# Patient Record
Sex: Female | Born: 1955 | Marital: Married | Smoking: Former smoker
Health system: Southern US, Community
[De-identification: ages and names within clinical notes are randomized; demographics above are authoritative.]

## PROBLEM LIST (undated history)

## (undated) DIAGNOSIS — R7301 Impaired fasting glucose: Secondary | ICD-10-CM

## (undated) DIAGNOSIS — E079 Disorder of thyroid, unspecified: Secondary | ICD-10-CM

## (undated) DIAGNOSIS — B070 Plantar wart: Secondary | ICD-10-CM

## (undated) DIAGNOSIS — F17200 Nicotine dependence, unspecified, uncomplicated: Secondary | ICD-10-CM

## (undated) DIAGNOSIS — B349 Viral infection, unspecified: Secondary | ICD-10-CM

## (undated) DIAGNOSIS — E785 Hyperlipidemia, unspecified: Secondary | ICD-10-CM

## (undated) DIAGNOSIS — U071 COVID-19: Secondary | ICD-10-CM

## (undated) DIAGNOSIS — E162 Hypoglycemia, unspecified: Secondary | ICD-10-CM

## (undated) DIAGNOSIS — E871 Hypo-osmolality and hyponatremia: Secondary | ICD-10-CM

## (undated) DIAGNOSIS — J309 Allergic rhinitis, unspecified: Secondary | ICD-10-CM

## (undated) DIAGNOSIS — F1020 Alcohol dependence, uncomplicated: Secondary | ICD-10-CM

## (undated) DIAGNOSIS — F39 Unspecified mood [affective] disorder: Secondary | ICD-10-CM

## (undated) DIAGNOSIS — D071 Carcinoma in situ of vulva: Secondary | ICD-10-CM

## (undated) DIAGNOSIS — Z720 Tobacco use: Secondary | ICD-10-CM

## (undated) DIAGNOSIS — I10 Essential (primary) hypertension: Secondary | ICD-10-CM

## (undated) DIAGNOSIS — T791XXA Fat embolism (traumatic), initial encounter: Secondary | ICD-10-CM

## (undated) DIAGNOSIS — E039 Hypothyroidism, unspecified: Secondary | ICD-10-CM

## (undated) DIAGNOSIS — T7411XA Adult physical abuse, confirmed, initial encounter: Secondary | ICD-10-CM

## (undated) DIAGNOSIS — I639 Cerebral infarction, unspecified: Secondary | ICD-10-CM

## (undated) DIAGNOSIS — F4310 Post-traumatic stress disorder, unspecified: Secondary | ICD-10-CM

## (undated) DIAGNOSIS — G459 Transient cerebral ischemic attack, unspecified: Secondary | ICD-10-CM

## (undated) DIAGNOSIS — J302 Other seasonal allergic rhinitis: Secondary | ICD-10-CM

## (undated) DIAGNOSIS — I739 Peripheral vascular disease, unspecified: Secondary | ICD-10-CM

## (undated) DIAGNOSIS — H9209 Otalgia, unspecified ear: Secondary | ICD-10-CM

## (undated) DIAGNOSIS — R29810 Facial weakness: Secondary | ICD-10-CM

## (undated) DIAGNOSIS — M5410 Radiculopathy, site unspecified: Secondary | ICD-10-CM

## (undated) DIAGNOSIS — J019 Acute sinusitis, unspecified: Secondary | ICD-10-CM

## (undated) DIAGNOSIS — C519 Malignant neoplasm of vulva, unspecified: Secondary | ICD-10-CM

## (undated) HISTORY — DX: Essential (primary) hypertension: I10

## (undated) HISTORY — DX: Cerebral infarction, unspecified: I63.9

## (undated) HISTORY — DX: Unspecified mood (affective) disorder: F39

## (undated) HISTORY — DX: Acute sinusitis, unspecified: J01.90

## (undated) HISTORY — DX: Hyperlipidemia, unspecified: E78.5

## (undated) HISTORY — DX: Fat embolism (traumatic), initial encounter: T79.1XXA

## (undated) HISTORY — DX: Radiculopathy, site unspecified: M54.10

## (undated) HISTORY — DX: Tobacco use: Z72.0

## (undated) HISTORY — DX: Nicotine dependence, unspecified, uncomplicated: F17.200

## (undated) HISTORY — DX: Carcinoma in situ of vulva: D07.1

## (undated) HISTORY — DX: Post-traumatic stress disorder, unspecified: F43.10

## (undated) HISTORY — DX: Hypoglycemia, unspecified: E16.2

## (undated) HISTORY — DX: Alcohol dependence, uncomplicated: F10.20

## (undated) HISTORY — DX: Facial weakness: R29.810

## (undated) HISTORY — DX: Allergic rhinitis, unspecified: J30.9

## (undated) HISTORY — DX: Hypothyroidism, unspecified: E03.9

## (undated) HISTORY — DX: Other seasonal allergic rhinitis: J30.2

## (undated) HISTORY — DX: Otalgia, unspecified ear: H92.09

## (undated) HISTORY — DX: Peripheral vascular disease, unspecified: I73.9

## (undated) HISTORY — DX: Malignant neoplasm of vulva, unspecified: C51.9

## (undated) HISTORY — DX: Impaired fasting glucose: R73.01

## (undated) HISTORY — DX: Hypo-osmolality and hyponatremia: E87.1

## (undated) HISTORY — DX: Adult physical abuse, confirmed, initial encounter: T74.11XA

## (undated) HISTORY — DX: Disorder of thyroid, unspecified: E07.9

## (undated) HISTORY — DX: Viral infection, unspecified: B34.9

## (undated) HISTORY — DX: Transient cerebral ischemic attack, unspecified: G45.9

## (undated) HISTORY — DX: COVID-19: U07.1

## (undated) HISTORY — DX: Plantar wart: B07.0

---

## 2000-08-30 ENCOUNTER — Other Ambulatory Visit: Admission: RE | Admit: 2000-08-30 | Discharge: 2000-08-30 | Payer: Self-pay | Admitting: Obstetrics & Gynecology

## 2001-10-31 ENCOUNTER — Other Ambulatory Visit: Admission: RE | Admit: 2001-10-31 | Discharge: 2001-10-31 | Payer: Self-pay | Admitting: Obstetrics & Gynecology

## 2002-11-06 ENCOUNTER — Other Ambulatory Visit: Admission: RE | Admit: 2002-11-06 | Discharge: 2002-11-06 | Payer: Self-pay | Admitting: Obstetrics & Gynecology

## 2002-11-13 ENCOUNTER — Encounter: Payer: Self-pay | Admitting: Obstetrics & Gynecology

## 2002-11-13 ENCOUNTER — Encounter: Admission: RE | Admit: 2002-11-13 | Discharge: 2002-11-13 | Payer: Self-pay | Admitting: Obstetrics & Gynecology

## 2003-11-19 ENCOUNTER — Other Ambulatory Visit: Admission: RE | Admit: 2003-11-19 | Discharge: 2003-11-19 | Payer: Self-pay | Admitting: Obstetrics & Gynecology

## 2004-12-01 ENCOUNTER — Other Ambulatory Visit: Admission: RE | Admit: 2004-12-01 | Discharge: 2004-12-01 | Payer: Self-pay | Admitting: Obstetrics & Gynecology

## 2009-12-09 ENCOUNTER — Encounter: Admission: RE | Admit: 2009-12-09 | Discharge: 2009-12-09 | Payer: Self-pay | Admitting: Obstetrics & Gynecology

## 2010-08-10 ENCOUNTER — Encounter: Payer: Self-pay | Admitting: Obstetrics & Gynecology

## 2012-08-04 HISTORY — PX: VULVAR LESION REMOVAL: SHX5391

## 2012-09-07 ENCOUNTER — Ambulatory Visit: Payer: BC Managed Care – PPO | Attending: Gynecologic Oncology | Admitting: Gynecologic Oncology

## 2012-09-07 ENCOUNTER — Encounter: Payer: Self-pay | Admitting: Gynecologic Oncology

## 2012-09-07 VITALS — BP 182/90 | HR 88 | Temp 98.6°F | Resp 18 | Ht 68.0 in | Wt 181.9 lb

## 2012-09-07 DIAGNOSIS — C519 Malignant neoplasm of vulva, unspecified: Secondary | ICD-10-CM | POA: Insufficient documentation

## 2012-09-07 NOTE — Patient Instructions (Signed)
Return to clinic in 3 months

## 2012-09-07 NOTE — Progress Notes (Signed)
Consult Note: Gyn-Onc  Meagan Riddle 57 y.o. female  CC:  Chief Complaint  Patient presents with  . Vulvar Cancer    New patient    HPI: Patient is seen today in consultation at the request of Dr. Konrad Dolores.  Patient is a 57 year old gravida 1 para 1 (she has a 55 year old son) who in about mid-Fall noticed a lesion on her vulva. It did not become painful until early part of January which time she went to see Dr. Jennette Kettle. 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 She then underwent a wide local excision of the vulva on January 16. Operative findings included a 1 cm lesion near the introitus but towards the left side. 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 There was high-grade dysplasia. There was a positive excisional margins. The deep margins were negative for dysplasia or malignancy. On pathology the tumor maximal size was 1.4 cm. It was a well-differentiated grade 1 squamous cell carcinoma. On the initial pathology report they stated that was difficult to determine the depth of invasion however the deep subcutaneous extension nor deep cervical margin involvement was identified. There was no perineural or lymphovascular space involvement. She comes in today for evaluation of the above.  Review of Systems: She has a change about bladder habits. She is sexually active. She did have some pain with intercourse prior to the surgical procedure has had pain since then as well. She believes the sutures are aggravating her and she went to have those removed. She has been under a lot of stress as she is one of the primary caretaker is for her mother who is 65 years old. Her review of systems is otherwise negative for 10 systems.  Her last mammogram was in 1 year ago. Her Pap smear was negative recently. She's never had a colonoscopy but she does the stool guaiac cards.  Current Meds:  Outpatient Encounter Prescriptions as of 09/07/2012  Medication Sig Dispense  Refill  . Ascorbic Acid (VITAMIN C PO) Take by mouth.      . estrogen, conjugated,-medroxyprogesterone (PREMPRO) 0.625-2.5 MG per tablet Take 1 tablet by mouth daily.      . fexofenadine-pseudoephedrine (ALLEGRA-D) 60-120 MG per tablet Take 1 tablet by mouth daily.      Marland Kitchen levothyroxine (SYNTHROID, LEVOTHROID) 137 MCG tablet Take 137 mcg by mouth daily.      Marland Kitchen liothyronine (CYTOMEL) 5 MCG tablet Take 5 mcg by mouth daily.      Marland Kitchen VITAMIN D, ERGOCALCIFEROL, PO Take by mouth daily.       No facility-administered encounter medications on file as of 09/07/2012.    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 She has cut back on her working schedule as she is a caretaker for her mom. History   Social History  . Marital Status: Married    Spouse Name: N/A    Number of Children: N/A  . Years of Education: N/A   Occupational History  . Not on file.   Social History Main Topics  . Smoking status: Former Smoker    Start date: 07/20/1977  . Smokeless tobacco: Not on file  . Alcohol Use: Yes     Comment: occas  . Drug Use: No  . Sexually Active: Yes   Other Topics Concern  . Not on file   Social History Narrative  . No narrative on file  Past Surgical Hx: Was into the extraction, and NSVD  Past Surgical History  Procedure Laterality Date  . Vulvar lesion removal  08/04/12    WLE of the vulva    Past Medical Hx:  Past Medical History  Diagnosis Date  . Vaginal delivery   . Hypothyroidism   . Vulvar cancer   . VIN III (vulvar intraepithelial neoplasia III)     Family Hx: Family believes that the breast cancers and her 3 paternal aunts as well as a prostate cancer father were due to exposures to pesticides as children [place]. There have not been any cancers in the subsequent generation. Family History  Problem Relation Age of Onset  . Hypothyroidism Mother   . Diabetes Mother   . Stroke  Mother   . Hypertension Mother   . Uterine cancer Mother   . Prostate cancer Father   . Hypothyroidism Sister   . Breast cancer Paternal Aunt   . Breast cancer Paternal Aunt   . Breast cancer Paternal Aunt     Vitals:  Blood pressure 182/90, pulse 88, temperature 98.6 F (37 C), resp. rate 18, height 5\' 8"  (1.727 m), weight 181 lb 14.4 oz (82.509 kg).  Physical Exam: Well-nourished well-developed female in no acute distress.  Neck: Supple, no lymphadenopathy, no thyromegaly.  Lungs: Clear to auscultation bilaterally.  Cardiovascular: Regular rate and rhythm.  Abdomen: Soft, nondistended. There's no palpable masses or hepatosplenomegaly.  Groins: No lymphadenopathy.  Extremities: No edema.  Pelvic: External genitalia is notable for suture line on the left side from the mid labia might drop down towards the introitus. Sutures are possible but difficult to visualize as they are Monocryl. At the patient's request they were removed. The vagina was inspected carefully there is no other visible lesions. The area is healing well.  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 On the initial pathology report it is difficult to ascertain depth of invasion. We therefore asked Hollice Espy in pathology to review the pathology. She states it appears primarily to be carcinoma in situ with multifocal areas of microinvasive squamous cell carcinoma. There is no deep invasion and there is no invasion greater than 1 mm. She will dictate an addendum to the pathology report.  I will communicate these results to the patient. As she does not require a lymphadenectomy, I do not believe we necessarily have to re-excise the margins. Deep margins were negative for any cancer as well as the peripheral margins were negative for cancer. She will return to see [place] in 3 months for surveillance. We'll alternate visits with Dr. Jennette Kettle every 3-4 months for the first 2 years and then every 6 months  thereafter. The patient did inspect her vulva with the mirror today. She was encouraged to self vulvar checks once a month. Again, this pathology report was not available in the patient was in clinic today I will contact her now to review.  Hudsyn Barich A., MD 09/07/2012, 1:28 PM

## 2013-01-26 ENCOUNTER — Ambulatory Visit: Payer: BC Managed Care – PPO | Admitting: Gynecologic Oncology

## 2013-03-08 ENCOUNTER — Ambulatory Visit: Payer: BC Managed Care – PPO | Attending: Gynecologic Oncology | Admitting: Gynecologic Oncology

## 2013-03-30 ENCOUNTER — Ambulatory Visit: Payer: BC Managed Care – PPO | Attending: Gynecologic Oncology | Admitting: Gynecologic Oncology

## 2017-10-27 ENCOUNTER — Ambulatory Visit: Payer: BLUE CROSS/BLUE SHIELD | Admitting: Emergency Medicine

## 2017-10-27 ENCOUNTER — Encounter: Payer: Self-pay | Admitting: Emergency Medicine

## 2017-10-27 VITALS — BP 160/70 | HR 77 | Temp 98.4°F | Resp 17 | Ht 67.5 in | Wt 174.0 lb

## 2017-10-27 DIAGNOSIS — M545 Low back pain, unspecified: Secondary | ICD-10-CM | POA: Insufficient documentation

## 2017-10-27 DIAGNOSIS — J0100 Acute maxillary sinusitis, unspecified: Secondary | ICD-10-CM | POA: Diagnosis not present

## 2017-10-27 MED ORDER — AMOXICILLIN 875 MG PO TABS: 875.0000 mg | ORAL_TABLET | Freq: Two times a day (BID) | ORAL | 0 refills | Status: DC

## 2017-10-27 MED ORDER — METHYLPREDNISOLONE ACETATE 80 MG/ML IJ SUSP
80.0000 mg | Freq: Once | INTRAMUSCULAR | Status: AC
  Administered 2017-10-27: 80 mg via INTRAMUSCULAR

## 2017-10-27 NOTE — Progress Notes (Signed)
Meagan Riddle 62 y.o.   Chief Complaint  Patient presents with  . Back Pain    HISTORY OF PRESENT ILLNESS: This is a 62 y.o. female complaining of lumbar pain for the past few days.  Has had similar episodes in the past.  4 years ago they gave her a cortisone shot and it helped a great deal.  Denies neurological symptoms.  No bladder or bowel dysfunction.  Also complaining of sinus pressure and discharge.  Requesting amoxicillin.  Back Pain  This is a new problem. The current episode started in the past 7 days. The problem occurs constantly. The problem has been waxing and waning since onset. The pain is present in the lumbar spine. The quality of the pain is described as aching. The pain does not radiate. The pain is at a severity of 5/10. The pain is moderate. The symptoms are aggravated by bending and position. Pertinent negatives include no abdominal pain, bladder incontinence, bowel incontinence, chest pain, dysuria, fever, headaches, leg pain, numbness, paresis, paresthesias, pelvic pain, perianal numbness, tingling, weakness or weight loss.     Prior to Admission medications   Medication Sig Start Date End Date Taking? Authorizing Provider  Ascorbic Acid (VITAMIN C PO) Take by mouth.   Yes [provider]  fexofenadine-pseudoephedrine (ALLEGRA-D) 60-120 MG per tablet Take 1 tablet by mouth daily.   Yes [provider]  levothyroxine (SYNTHROID, LEVOTHROID) 137 MCG tablet Take 137 mcg by mouth daily.   Yes [provider]  liothyronine (CYTOMEL) 5 MCG tablet Take 5 mcg by mouth daily.   Yes [provider]  VITAMIN D, ERGOCALCIFEROL, PO Take by mouth daily.   Yes [provider]  estrogen, conjugated,-medroxyprogesterone (PREMPRO) 0.625-2.5 MG per tablet Take 1 tablet by mouth daily.    [provider]    No Known Allergies  Patient Active Problem List   Diagnosis Date Noted  . Vulva cancer ([place]) 09/07/2012    Past Medical  History:  Diagnosis Date  . Hypothyroidism   . Vaginal delivery   . VIN III (vulvar intraepithelial neoplasia III)   . Vulvar cancer [facility])     Past Surgical History:  Procedure Laterality Date  . VULVAR LESION REMOVAL  08/04/12   WLE of the vulva    Social History   Socioeconomic History  . Marital status: Married    Spouse name: Not on file  . Number of children: Not on file  . Years of education: Not on file  . Highest education level: Not on file  Occupational History  . Not on file  Social Needs  . Financial resource strain: Not on file  . Food insecurity:    Worry: Not on file    Inability: Not on file  . Transportation needs:    Medical: Not on file    Non-medical: Not on file  Tobacco Use  . Smoking status: Former Smoker    Start date: 07/20/1977  . Smokeless tobacco: Never Used  Substance and Sexual Activity  . Alcohol use: Yes    Comment: occas  . Drug use: No  . Sexual activity: Yes  Lifestyle  . Physical activity:    Days per week: Not on file    Minutes per session: Not on file  . Stress: Not on file  Relationships  . Social connections:    Talks on phone: Not on file    Gets together: Not on file    Attends religious service: Not on file  Active member of club or organization: Not on file    Attends meetings of clubs or organizations: Not on file    Relationship status: Not on file  . Intimate partner violence:    Fear of current or ex partner: Not on file    Emotionally abused: Not on file    Physically abused: Not on file    Forced sexual activity: Not on file  Other Topics Concern  . Not on file  Social History Narrative  . Not on file    Family History  Problem Relation Age of Onset  . Hypothyroidism Mother   . Diabetes Mother   . Stroke Mother   . Hypertension Mother   . Uterine cancer Mother   . Prostate cancer Father   . Hypothyroidism Sister   . Breast cancer Paternal Aunt   . Breast cancer Paternal Aunt   . Breast  cancer Paternal Aunt      Review of Systems  Constitutional: Negative.  Negative for chills, fever and weight loss.  HENT: Positive for congestion and sinus pain. Negative for sore throat.   Respiratory: Negative for cough and shortness of breath.   Cardiovascular: Negative for chest pain and palpitations.  Gastrointestinal: Negative for abdominal pain, bowel incontinence, diarrhea, nausea and vomiting.  Genitourinary: Negative for bladder incontinence, dysuria, hematuria and pelvic pain.  Musculoskeletal: Positive for back pain.  Skin: Negative for rash.  Neurological: Negative for dizziness, tingling, weakness, numbness, headaches and paresthesias.  Endo/Heme/Allergies: Negative.     Vitals:   10/27/17 1453  BP: (!) 211/70  Pulse: 77  Resp: 17  Temp: 98.4 F (36.9 C)  SpO2: 98%    Physical Exam  Constitutional: She is oriented to person, place, and time. She appears well-developed and well-nourished.  HENT:  Head: Normocephalic and atraumatic.  Nose: Nose normal.  Mouth/Throat: Oropharynx is clear and moist. No oropharyngeal exudate.  Eyes: Pupils are equal, round, and reactive to light. Conjunctivae and EOM are normal.  Neck: Normal range of motion. Neck supple.  Cardiovascular: Normal rate and regular rhythm.  Pulmonary/Chest: Effort normal and breath sounds normal.  Musculoskeletal:       Lumbar back: She exhibits decreased range of motion and tenderness. She exhibits no bony tenderness, no spasm and normal pulse.       Back:  Neurological: She is alert and oriented to person, place, and time. She displays normal reflexes. No sensory deficit. She exhibits normal muscle tone.  Skin: Skin is warm and dry. Capillary refill takes less than 2 seconds.  Psychiatric: She has a normal mood and affect. Her behavior is normal.  Vitals reviewed.    ASSESSMENT & PLAN: Meagan Riddle was seen today for back pain.  Diagnoses and all orders for this visit:  Lumbar pain -      methylPREDNISolone acetate (DEPO-MEDROL) injection 80 mg  Acute non-recurrent maxillary sinusitis -     amoxicillin (AMOXIL) 875 MG tablet; Take 1 tablet (875 mg total) by mouth 2 (two) times daily.    Patient Instructions       IF you received an x-ray today, you will receive an invoice from [facility]. Please contact [facility] at [phone] with questions or concerns regarding your invoice.   IF you received labwork today, you will receive an invoice from [facility]. Please contact [facility] at [phone] with questions or concerns regarding your invoice.   Our billing staff will not be able to assist you with questions regarding bills from these companies.  You  will be contacted with the lab results as soon as they are available. The fastest way to get your results is to activate your My Chart account. Instructions are located on the last page of this paperwork. If you have not heard from [place] regarding the results in 2 weeks, please contact this office.      Sinusitis, Adult Sinusitis is soreness and inflammation of your sinuses. Sinuses are hollow spaces in the bones around your face. They are located:  Around your eyes.  In the middle of your forehead.  Behind your nose.  In your cheekbones.  Your sinuses and nasal passages are lined with a stringy fluid (mucus). Mucus normally drains out of your sinuses. When your nasal tissues get inflamed or swollen, the mucus can get trapped or blocked so air cannot flow through your sinuses. This lets bacteria, viruses, and funguses grow, and that leads to infection. Follow these instructions at home: Medicines  Take, use, or apply over-the-counter and prescription medicines only as told by your doctor. These may include nasal sprays.  If you were prescribed an antibiotic medicine, take it as told by your doctor. Do not stop taking the antibiotic even if you start to feel better. Hydrate and Humidify  Drink  enough water to keep your pee (urine) clear or pale yellow.  Use a cool mist humidifier to keep the humidity level in your home above 50%.  Breathe in steam for 10-15 minutes, 3-4 times a day or as told by your doctor. You can do this in the bathroom while a hot shower is running.  Try not to spend time in cool or dry air. Rest  Rest as much as possible.  Sleep with your head raised (elevated).  Make sure to get enough sleep each night. General instructions  Put a warm, moist washcloth on your face 3-4 times a day or as told by your doctor. This will help with discomfort.  Wash your hands often with soap and water. If there is no soap and water, use hand sanitizer.  Do not smoke. Avoid being around people who are smoking (secondhand smoke).  Keep all follow-up visits as told by your doctor. This is important. Contact a doctor if:  You have a fever.  Your symptoms get worse.  Your symptoms do not get better within 10 days. Get help right away if:  You have a very bad headache.  You cannot stop throwing up (vomiting).  You have pain or swelling around your face or eyes.  You have trouble seeing.  You feel confused.  Your neck is stiff.  You have trouble breathing. This information is not intended to replace advice given to you by your health care provider. Make sure you discuss any questions you have with your health care provider. Document Released: 12/23/2007 Document Revised: 03/01/2016 Document Reviewed: 05/01/2015 Elsevier Interactive Patient Education  2018 [place].  Back Pain, Adult Back pain is very common. The pain often gets better over time. The cause of back pain is usually not dangerous. Most people can learn to manage their back pain on their own. Follow these instructions at home: Watch your back pain for any changes. The following actions may help to lessen any pain you are feeling:  Stay active. Start with short walks on flat ground if you  can. Try to walk farther each day.  Exercise regularly as told by your doctor. Exercise helps your back heal faster. It also helps avoid future injury by keeping your  muscles strong and flexible.  Do not sit, drive, or stand in one place for more than 30 minutes.  Do not stay in bed. Resting more than 1-2 days can slow down your recovery.  Be careful when you bend or lift an object. Use good form when lifting: ? Bend at your knees. ? Keep the object close to your body. ? Do not twist.  Sleep on a firm mattress. Lie on your side, and bend your knees. If you lie on your back, put a pillow under your knees.  Take medicines only as told by your doctor.  Put ice on the injured area. ? Put ice in a plastic bag. ? Place a towel between your skin and the bag. ? Leave the ice on for 20 minutes, 2-3 times a day for the first 2-3 days. After that, you can switch between ice and heat packs.  Avoid feeling anxious or stressed. Find good ways to deal with stress, such as exercise.  Maintain a healthy weight. Extra weight puts stress on your back.  Contact a doctor if:  You have pain that does not go away with rest or medicine.  You have worsening pain that goes down into your legs or buttocks.  You have pain that does not get better in one week.  You have pain at night.  You lose weight.  You have a fever or chills. Get help right away if:  You cannot control when you poop (bowel movement) or pee (urinate).  Your arms or legs feel weak.  Your arms or legs lose feeling (numbness).  You feel sick to your stomach (nauseous) or throw up (vomit).  You have belly (abdominal) pain.  You feel like you may pass out (faint). This information is not intended to replace advice given to you by your health care provider. Make sure you discuss any questions you have with your health care provider. Document Released: 12/23/2007 Document Revised: 12/12/2015 Document Reviewed:  11/07/2013 Elsevier Interactive Patient Education  2018 Elsevier Inc.      Agustina Caroli, MD [facility]

## 2017-10-27 NOTE — Patient Instructions (Addendum)
IF you received an x-ray today, you will receive an invoice from [facility]. Please contact [facility] at [phone] with questions or concerns regarding your invoice.   IF you received labwork today, you will receive an invoice from [facility]. Please contact [facility] at [phone] with questions or concerns regarding your invoice.   Our billing staff will not be able to assist you with questions regarding bills from these companies.  You will be contacted with the lab results as soon as they are available. The fastest way to get your results is to activate your My Chart account. Instructions are located on the last page of this paperwork. If you have not heard from [place] regarding the results in 2 weeks, please contact this office.      Sinusitis, Adult Sinusitis is soreness and inflammation of your sinuses. Sinuses are hollow spaces in the bones around your face. They are located:  Around your eyes.  In the middle of your forehead.  Behind your nose.  In your cheekbones.  Your sinuses and nasal passages are lined with a stringy fluid (mucus). Mucus normally drains out of your sinuses. When your nasal tissues get inflamed or swollen, the mucus can get trapped or blocked so air cannot flow through your sinuses. This lets bacteria, viruses, and funguses grow, and that leads to infection. Follow these instructions at home: Medicines  Take, use, or apply over-the-counter and prescription medicines only as told by your doctor. These may include nasal sprays.  If you were prescribed an antibiotic medicine, take it as told by your doctor. Do not stop taking the antibiotic even if you start to feel better. Hydrate and Humidify  Drink enough water to keep your pee (urine) clear or pale yellow.  Use a cool mist humidifier to keep the humidity level in your home above 50%.  Breathe in steam for 10-15 minutes, 3-4 times a day or as told by your doctor. You can do  this in the bathroom while a hot shower is running.  Try not to spend time in cool or dry air. Rest  Rest as much as possible.  Sleep with your head raised (elevated).  Make sure to get enough sleep each night. General instructions  Put a warm, moist washcloth on your face 3-4 times a day or as told by your doctor. This will help with discomfort.  Wash your hands often with soap and water. If there is no soap and water, use hand sanitizer.  Do not smoke. Avoid being around people who are smoking (secondhand smoke).  Keep all follow-up visits as told by your doctor. This is important. Contact a doctor if:  You have a fever.  Your symptoms get worse.  Your symptoms do not get better within 10 days. Get help right away if:  You have a very bad headache.  You cannot stop throwing up (vomiting).  You have pain or swelling around your face or eyes.  You have trouble seeing.  You feel confused.  Your neck is stiff.  You have trouble breathing. This information is not intended to replace advice given to you by your health care provider. Make sure you discuss any questions you have with your health care provider. Document Released: 12/23/2007 Document Revised: 03/01/2016 Document Reviewed: 05/01/2015 Elsevier Interactive Patient Education  2018 [place].  Back Pain, Adult Back pain is very common. The pain often gets better over time. The cause of back pain is usually not dangerous. Most people  can learn to manage their back pain on their own. Follow these instructions at home: Watch your back pain for any changes. The following actions may help to lessen any pain you are feeling:  Stay active. Start with short walks on flat ground if you can. Try to walk farther each day.  Exercise regularly as told by your doctor. Exercise helps your back heal faster. It also helps avoid future injury by keeping your muscles strong and flexible.  Do not sit, drive, or stand in one  place for more than 30 minutes.  Do not stay in bed. Resting more than 1-2 days can slow down your recovery.  Be careful when you bend or lift an object. Use good form when lifting: ? Bend at your knees. ? Keep the object close to your body. ? Do not twist.  Sleep on a firm mattress. Lie on your side, and bend your knees. If you lie on your back, put a pillow under your knees.  Take medicines only as told by your doctor.  Put ice on the injured area. ? Put ice in a plastic bag. ? Place a towel between your skin and the bag. ? Leave the ice on for 20 minutes, 2-3 times a day for the first 2-3 days. After that, you can switch between ice and heat packs.  Avoid feeling anxious or stressed. Find good ways to deal with stress, such as exercise.  Maintain a healthy weight. Extra weight puts stress on your back.  Contact a doctor if:  You have pain that does not go away with rest or medicine.  You have worsening pain that goes down into your legs or buttocks.  You have pain that does not get better in one week.  You have pain at night.  You lose weight.  You have a fever or chills. Get help right away if:  You cannot control when you poop (bowel movement) or pee (urinate).  Your arms or legs feel weak.  Your arms or legs lose feeling (numbness).  You feel sick to your stomach (nauseous) or throw up (vomit).  You have belly (abdominal) pain.  You feel like you may pass out (faint). This information is not intended to replace advice given to you by your health care provider. Make sure you discuss any questions you have with your health care provider. Document Released: 12/23/2007 Document Revised: 12/12/2015 Document Reviewed: 11/07/2013 Elsevier Interactive Patient Education  [employer].

## 2017-11-26 ENCOUNTER — Ambulatory Visit
Admission: RE | Admit: 2017-11-26 | Discharge: 2017-11-26 | Disposition: A | Discharge status: Home / Self Care | Payer: BLUE CROSS/BLUE SHIELD | Source: Ambulatory Visit | Attending: Family Medicine | Admitting: Family Medicine

## 2017-11-26 ENCOUNTER — Other Ambulatory Visit: Payer: Self-pay | Admitting: Family Medicine

## 2017-11-26 DIAGNOSIS — M5416 Radiculopathy, lumbar region: Secondary | ICD-10-CM

## 2018-05-22 IMAGING — CR DG LUMBAR SPINE COMPLETE 4+V
5 series · 5 of 5 positions shown · non-contrast
Comparison: None

CLINICAL DATA: Intermittent lower back pain for 4 weeks with
radiation down LEFT leg, remote injury 10 years ago, no recent
injury

EXAM:
LUMBAR SPINE - COMPLETE 4+ VIEW

[w lumbar spine ap]
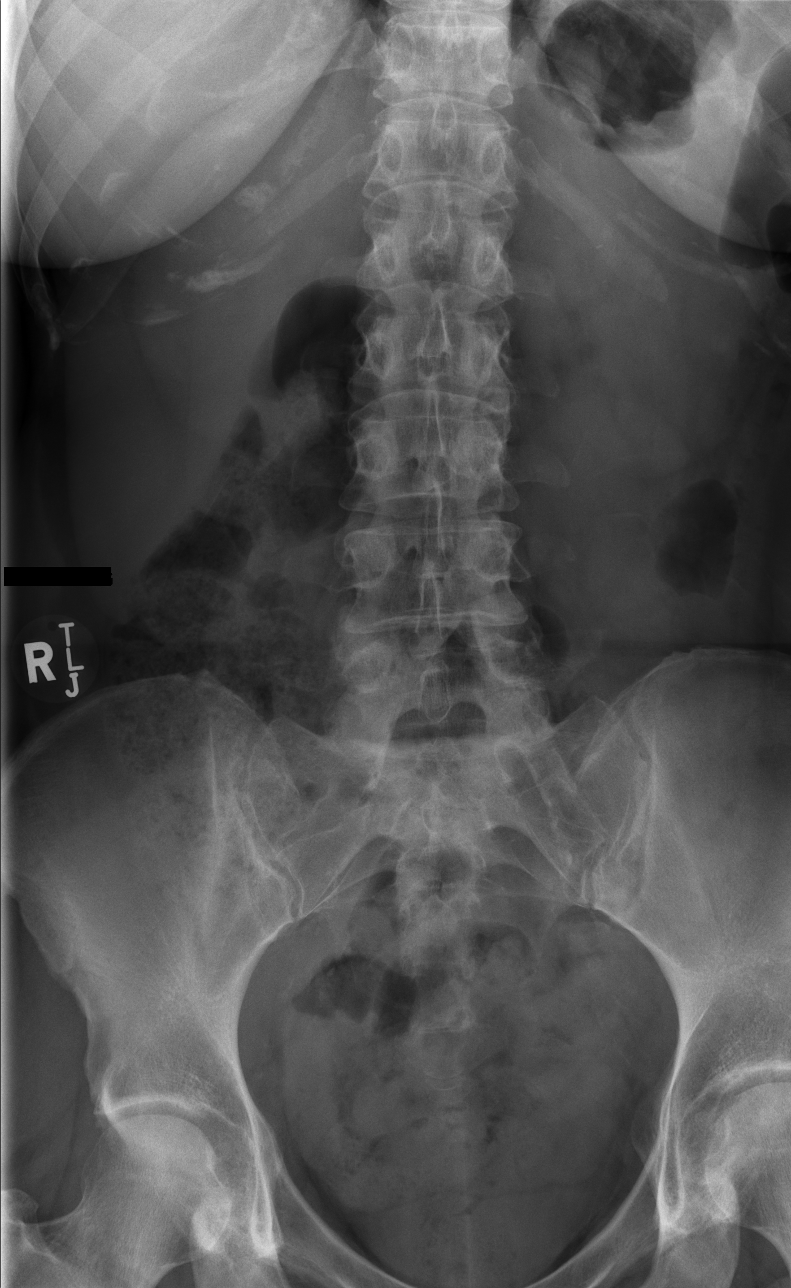

[w lumbar spine obl (1 of 2)]
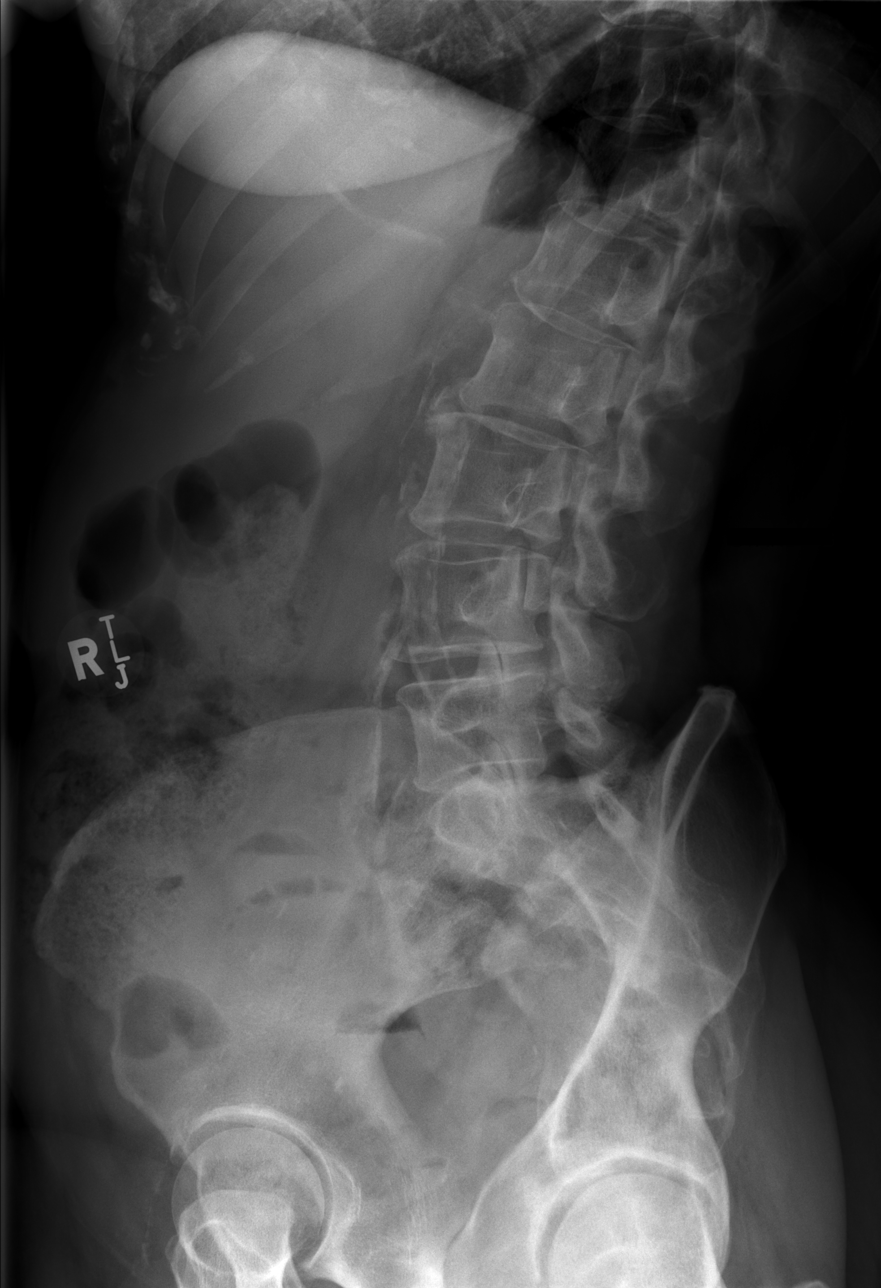

[w lumbar spine obl (2 of 2)]
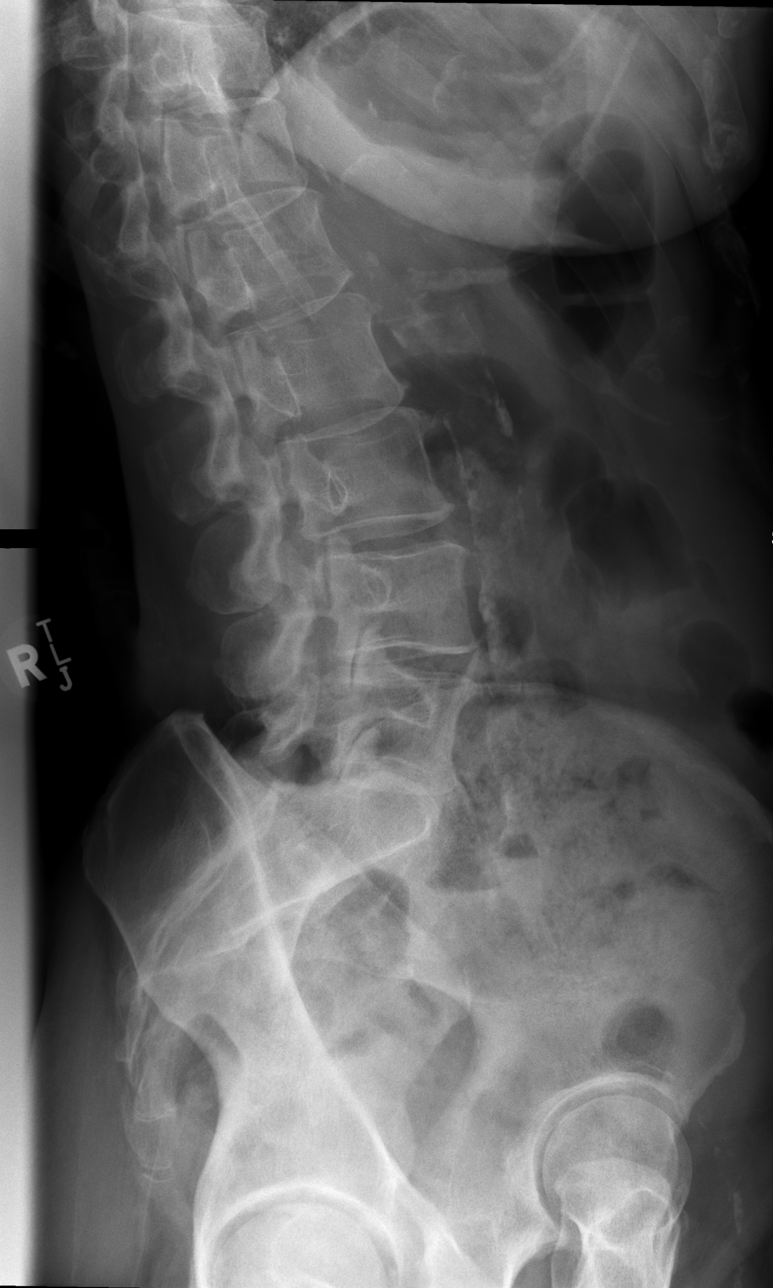

[w lumbar spine lat]
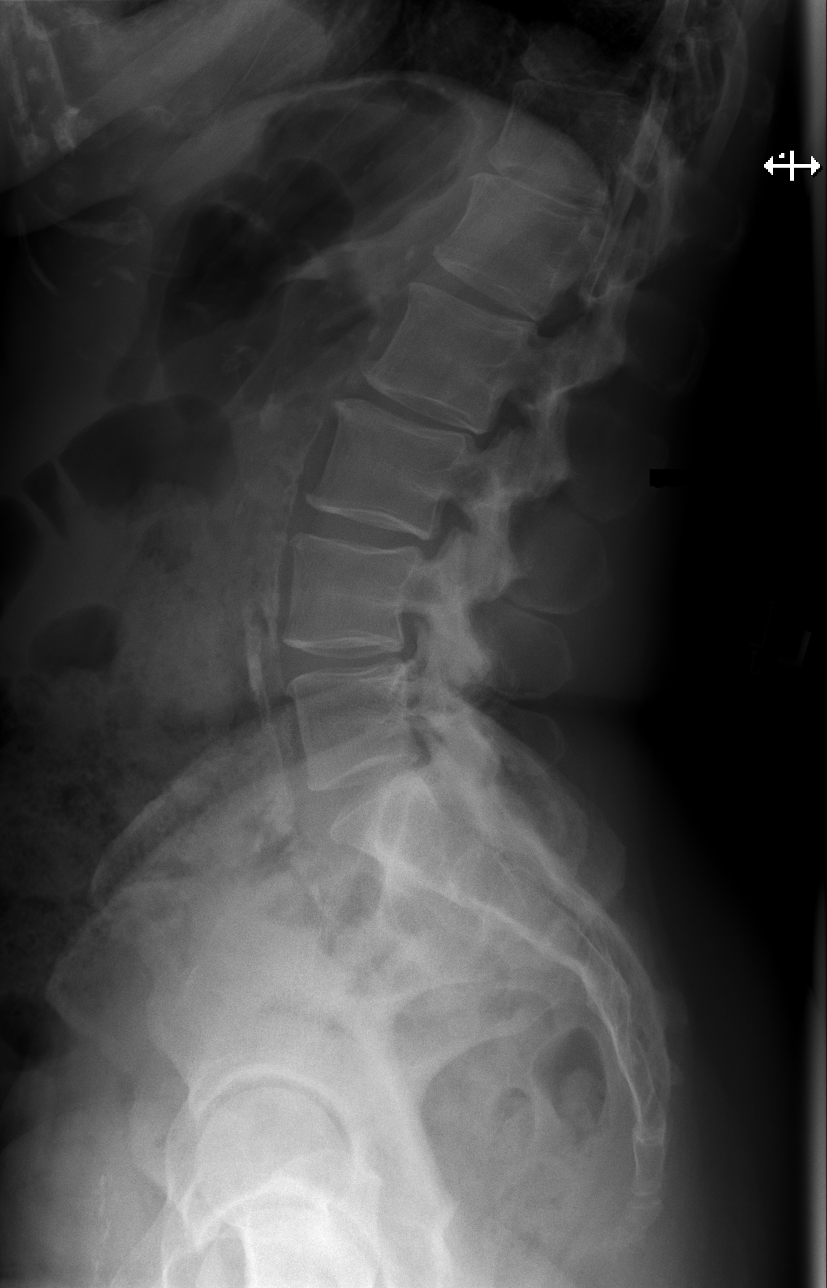

[w lumbar l-5 s-1 spot]
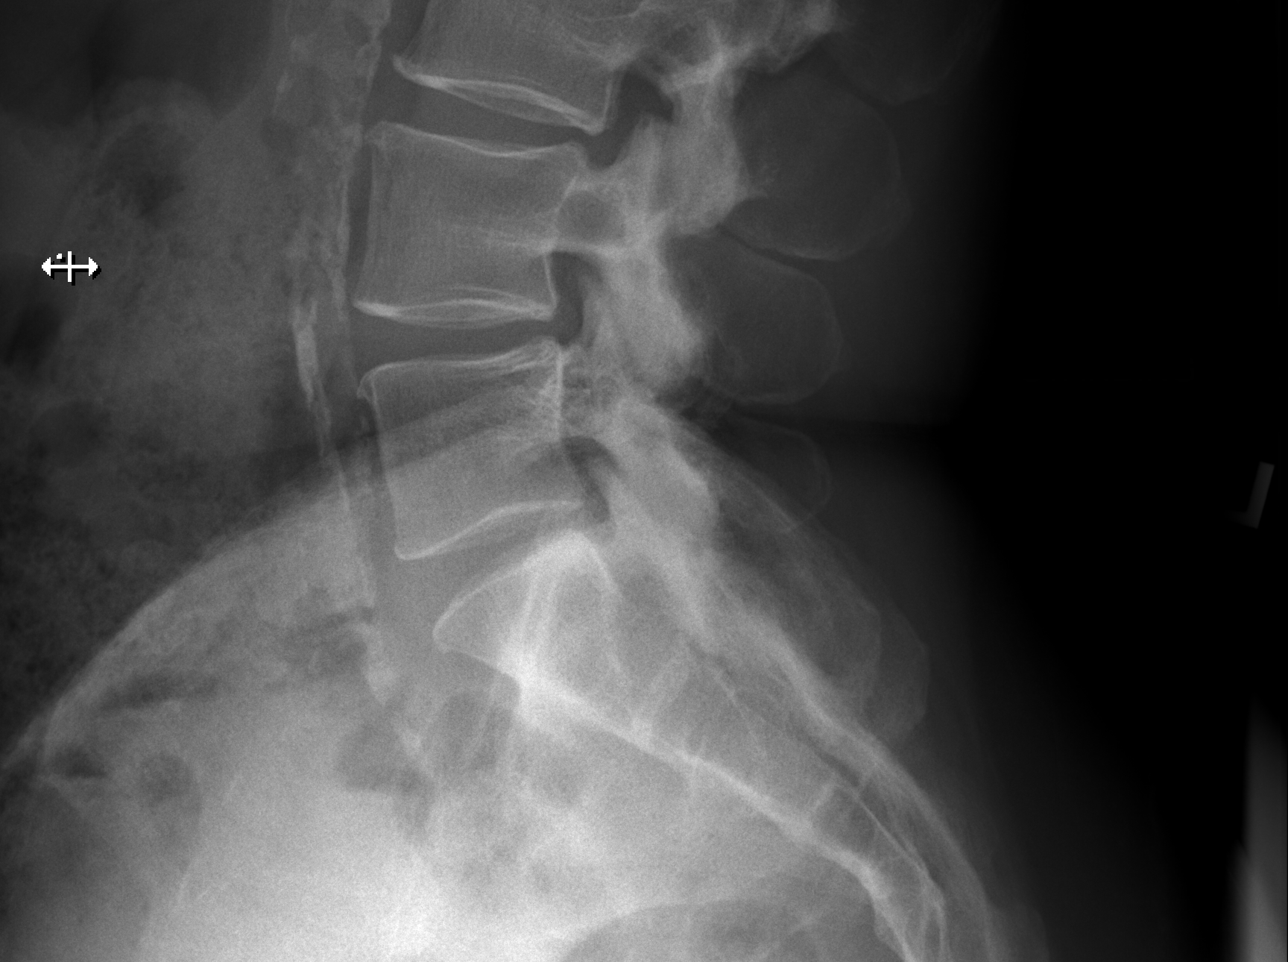

[5 of 5 positions shown; findings below may reference images not displayed]

FINDINGS: 5 non-rib-bearing lumbar vertebra.

Minimal dextroconvex scoliosis.

Mild facet degenerative changes lower lumbar spine.

Minimal disc space narrowing and endplate spur formation at L2-L3.

Scattered mild disc space narrowing at remaining levels.

No fracture, subluxation, or bone destruction.

SI joints preserved.

Atherosclerotic calcifications of aorta and iliac arteries.
IMPRESSION: Minimal degenerative changes.

No acute abnormalities.

## 2018-07-26 ENCOUNTER — Other Ambulatory Visit: Payer: Self-pay | Admitting: Family Medicine

## 2018-07-26 ENCOUNTER — Telehealth: Payer: Self-pay | Admitting: Cardiology

## 2018-07-26 DIAGNOSIS — L97521 Non-pressure chronic ulcer of other part of left foot limited to breakdown of skin: Secondary | ICD-10-CM

## 2018-07-27 ENCOUNTER — Ambulatory Visit
Admission: RE | Admit: 2018-07-27 | Discharge: 2018-07-27 | Disposition: A | Discharge status: Home / Self Care | Payer: BLUE CROSS/BLUE SHIELD | Source: Ambulatory Visit | Attending: Family Medicine | Admitting: Family Medicine

## 2018-07-27 ENCOUNTER — Ambulatory Visit (HOSPITAL_COMMUNITY)
Admission: RE | Admit: 2018-07-27 | Discharge: 2018-07-27 | Disposition: A | Discharge status: Home / Self Care | Payer: BLUE CROSS/BLUE SHIELD | Source: Ambulatory Visit | Attending: Cardiology | Admitting: Cardiology

## 2018-07-27 ENCOUNTER — Other Ambulatory Visit: Payer: Self-pay | Admitting: Family Medicine

## 2018-07-27 DIAGNOSIS — L98499 Non-pressure chronic ulcer of skin of other sites with unspecified severity: Secondary | ICD-10-CM

## 2018-07-27 DIAGNOSIS — L97521 Non-pressure chronic ulcer of other part of left foot limited to breakdown of skin: Secondary | ICD-10-CM | POA: Diagnosis present

## 2018-08-03 ENCOUNTER — Encounter (HOSPITAL_BASED_OUTPATIENT_CLINIC_OR_DEPARTMENT_OTHER): Payer: BLUE CROSS/BLUE SHIELD | Attending: Internal Medicine

## 2018-08-03 DIAGNOSIS — I739 Peripheral vascular disease, unspecified: Secondary | ICD-10-CM | POA: Insufficient documentation

## 2018-08-03 DIAGNOSIS — Z87891 Personal history of nicotine dependence: Secondary | ICD-10-CM | POA: Diagnosis not present

## 2018-08-03 DIAGNOSIS — L97522 Non-pressure chronic ulcer of other part of left foot with fat layer exposed: Secondary | ICD-10-CM | POA: Insufficient documentation

## 2018-08-08 ENCOUNTER — Other Ambulatory Visit (HOSPITAL_COMMUNITY): Payer: Self-pay | Admitting: Family Medicine

## 2018-08-08 DIAGNOSIS — R6889 Other general symptoms and signs: Secondary | ICD-10-CM

## 2018-08-08 DIAGNOSIS — I739 Peripheral vascular disease, unspecified: Secondary | ICD-10-CM

## 2018-08-11 ENCOUNTER — Ambulatory Visit (HOSPITAL_COMMUNITY): Payer: BLUE CROSS/BLUE SHIELD

## 2018-08-11 DIAGNOSIS — L97522 Non-pressure chronic ulcer of other part of left foot with fat layer exposed: Secondary | ICD-10-CM | POA: Diagnosis not present

## 2018-08-12 ENCOUNTER — Other Ambulatory Visit (HOSPITAL_COMMUNITY): Payer: Self-pay | Admitting: Family Medicine

## 2018-08-15 ENCOUNTER — Other Ambulatory Visit (HOSPITAL_COMMUNITY): Payer: Self-pay | Admitting: Family Medicine

## 2018-08-15 DIAGNOSIS — I739 Peripheral vascular disease, unspecified: Secondary | ICD-10-CM

## 2018-08-15 DIAGNOSIS — R6889 Other general symptoms and signs: Secondary | ICD-10-CM

## 2018-08-16 ENCOUNTER — Ambulatory Visit (HOSPITAL_BASED_OUTPATIENT_CLINIC_OR_DEPARTMENT_OTHER)
Admission: RE | Admit: 2018-08-16 | Discharge: 2018-08-16 | Disposition: A | Discharge status: Home / Self Care | Payer: BLUE CROSS/BLUE SHIELD | Source: Ambulatory Visit | Attending: Internal Medicine | Admitting: Internal Medicine

## 2018-08-16 ENCOUNTER — Ambulatory Visit (HOSPITAL_COMMUNITY)
Admission: RE | Admit: 2018-08-16 | Discharge: 2018-08-16 | Disposition: A | Discharge status: Home / Self Care | Payer: BLUE CROSS/BLUE SHIELD | Source: Ambulatory Visit | Attending: Internal Medicine | Admitting: Internal Medicine

## 2018-08-16 DIAGNOSIS — I739 Peripheral vascular disease, unspecified: Secondary | ICD-10-CM | POA: Diagnosis present

## 2018-08-16 DIAGNOSIS — R6889 Other general symptoms and signs: Secondary | ICD-10-CM | POA: Insufficient documentation

## 2018-08-30 ENCOUNTER — Ambulatory Visit (INDEPENDENT_AMBULATORY_CARE_PROVIDER_SITE_OTHER): Payer: BLUE CROSS/BLUE SHIELD | Admitting: Orthopedic Surgery

## 2018-08-31 ENCOUNTER — Ambulatory Visit (INDEPENDENT_AMBULATORY_CARE_PROVIDER_SITE_OTHER): Payer: BLUE CROSS/BLUE SHIELD | Admitting: Family

## 2018-08-31 ENCOUNTER — Encounter (INDEPENDENT_AMBULATORY_CARE_PROVIDER_SITE_OTHER): Payer: Self-pay | Admitting: Orthopedic Surgery

## 2018-08-31 VITALS — Ht 67.5 in | Wt 174.0 lb

## 2018-08-31 DIAGNOSIS — L97511 Non-pressure chronic ulcer of other part of right foot limited to breakdown of skin: Secondary | ICD-10-CM

## 2018-08-31 MED ORDER — NITROGLYCERIN 0.2 MG/HR TD PT24: 0.2000 mg | MEDICATED_PATCH | Freq: Every day | TRANSDERMAL | 0 refills | Status: AC

## 2018-08-31 NOTE — Progress Notes (Signed)
Office Visit Note   Patient: Meagan Riddle           Date of Birth: 07-Sep-1955           MRN: [id number] Visit Date: 08/31/2018              Requested by: Hayden Rasmussen, MD [address] [place], [place] [address] PCP: Hayden Rasmussen, MD  Chief Complaint  Patient presents with  . Left Foot - Follow-up, Pain    NP; Concerns of ulcer great toe      HPI: The patient is a 63 year old woman who presents today for initial evaluation of an ischemic ulcer to her right great toe.  She is seen in referral from Dr. Darron Doom.  She states that just following Thanksgiving of last year she started to have a speck a small wound and soreness to the tip of her toe, this was following her using a safety pin to remove some pine needle from her toe.  The ulcer progressed and was eschared.  She has had vascular studies.  Has recently started on cilostazol.  Feels her ulcer has begun improving she has begun using Santyl.  States she has been applying a Oreo cream sized thickness of Santyl to the wound.  States her poor circulation was the reason she was having delayed healing.  Has completed a course of antibiotics.  Assessment & Plan: Visit Diagnoses: No diagnosis found.  Plan: Discussed proper application of Santyl she will apply a very thin layer only to exudative tissue.  Cleanse this daily.  One tissue improves and is granulating will stop Santyl and use Neosporin.  Have called in a prescription for nitroglycerin patches to rotate on the dorsum of her foot.  She will follow in our office in 2 weeks.  Discussed return precautions.  Follow-Up Instructions: No follow-ups on file.   Ortho Exam  Patient is alert, oriented, no adenopathy, well-dressed, normal affect, normal respiratory effort. On examination of the distal tip of right great toe there is a ulceration to the distal tip this is 1 cm in diameter about 1 mm thickness of exudative tissue.  There is slight surrounding redness no  warmth no odor no drainage no sign of infection. Weak dorsalis pedis pulse palpated.  Imaging: No results found. No images are attached to the encounter.  Labs: No results found for: HGBA1C, ESRSEDRATE, CRP, LABURIC, REPTSTATUS, GRAMSTAIN, CULT, LABORGA   No results found for: ALBUMIN, PREALBUMIN, LABURIC  Body mass index is 26.85 kg/m.  Orders:  No orders of the defined types were placed in this encounter.  Meds ordered this encounter  Medications  . nitroGLYCERIN (NITRODUR - DOSED IN MG/24 HR) 0.2 mg/hr patch    Sig: Place 1 patch (0.2 mg total) onto the skin daily.    Dispense:  30 patch    Refill:  0    Apply patch near the effected area, change location daily     Procedures: No procedures performed  Clinical Data: No additional findings.  ROS:  All other systems negative, except as noted in the HPI. Review of Systems  Constitutional: Negative for chills and fever.  Skin: Positive for color change and wound.    Objective: Vital Signs: Ht 5' 7.5" (1.715 m)   Wt 174 lb (78.9 kg)   BMI 26.85 kg/m   Specialty Comments:  No specialty comments available.  PMFS History: Patient Active Problem List   Diagnosis Date Noted  . Lumbar pain 10/27/2017  .  Acute non-recurrent maxillary sinusitis 10/27/2017  . 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 Hypothyroidism   . Seasonal allergies   . Thyroid disease   . Vaginal delivery   . VIN III (vulvar intraepithelial neoplasia III)   . Vulvar cancer ([place])     Family History  Problem Relation Age of Onset  . Hypothyroidism Mother   . Diabetes Mother   . Stroke Mother   . Hypertension Mother   . Uterine cancer Mother   . Prostate cancer Father   . Hypothyroidism Sister   . Breast cancer Paternal Aunt   . Breast cancer Paternal Aunt   . Breast cancer Paternal Aunt     Past Surgical History:  Procedure Laterality Date  . VULVAR LESION REMOVAL  08/04/12   WLE of the vulva    Social History   Occupational History  . Not on file  Tobacco Use  . Smoking status: Former Smoker    Start date: 07/20/1977  . Smokeless tobacco: Never Used  Substance and Sexual Activity  . Alcohol use: Yes    Alcohol/week: 2.0 standard drinks    Types: 2 Glasses of wine per week    Comment: occas  . Drug use: No  . Sexual activity: Yes

## 2018-09-14 ENCOUNTER — Ambulatory Visit (INDEPENDENT_AMBULATORY_CARE_PROVIDER_SITE_OTHER): Payer: BLUE CROSS/BLUE SHIELD | Admitting: Family

## 2018-09-14 ENCOUNTER — Ambulatory Visit (INDEPENDENT_AMBULATORY_CARE_PROVIDER_SITE_OTHER): Payer: BLUE CROSS/BLUE SHIELD | Admitting: Orthopedic Surgery

## 2018-09-14 NOTE — Telephone Encounter (Signed)
Encounter not needed

## 2018-09-15 ENCOUNTER — Encounter (INDEPENDENT_AMBULATORY_CARE_PROVIDER_SITE_OTHER): Payer: Self-pay | Admitting: Orthopedic Surgery

## 2018-09-15 ENCOUNTER — Ambulatory Visit (INDEPENDENT_AMBULATORY_CARE_PROVIDER_SITE_OTHER): Payer: BLUE CROSS/BLUE SHIELD | Admitting: Physician Assistant

## 2018-09-15 VITALS — Ht 67.5 in | Wt 174.0 lb

## 2018-09-15 DIAGNOSIS — L97511 Non-pressure chronic ulcer of other part of right foot limited to breakdown of skin: Secondary | ICD-10-CM | POA: Diagnosis not present

## 2018-09-15 DIAGNOSIS — R6889 Other general symptoms and signs: Secondary | ICD-10-CM | POA: Diagnosis not present

## 2018-09-16 ENCOUNTER — Encounter (INDEPENDENT_AMBULATORY_CARE_PROVIDER_SITE_OTHER): Payer: Self-pay | Admitting: Physician Assistant

## 2018-09-16 NOTE — Progress Notes (Signed)
Office Visit Note   Patient: Meagan Riddle           Date of Birth: 1956/02/19           MRN: [id number] Visit Date: 09/15/2018              Requested by: Hayden Rasmussen, MD [address] [place],  [address] PCP: Hayden Rasmussen, MD  Chief Complaint  Patient presents with  . Right Foot - Pain      HPI: The patient is a 63 year old woman who presents for follow-up of her ischemic ulcer of her right great toe.  She states that around Thanksgiving of last year she started to have a small wound over the tip of her toe following the use of a safety pin to remove a pine needle from her toe.  The ulcer progressed and was eschared.  She has been utilizing a more appropriate amount of Santyl ointment to the residual eschar and it does appear to be improving.  She was started on a nitroglycerin patch and is also on cilostazol to improve her circulation.   She did have abnormal ABIs with the right side showing atherosclerosis in the common femoral, femoral, and popliteal and tibial arteries.  50 to 74% stenosis in the ostium SFA, high end of the range.  The SFA appears to be occluded in the proximal to midportion with low velocity flow distally.  There was two-vessel runoff with probable occlusion of the posterior tibial artery.  The left lower extremity also had atherosclerotic changes but there was 50 to 74% stenosis of the tibioperoneal trunk but three-vessel runoff.   Assessment & Plan: Visit Diagnoses:  1. Skin ulcer of toe of right foot, limited to breakdown of skin ([place])   2. Abnormal ankle brachial index (ABI)     Plan: The patient can continue to utilize a small amount of Santyl to the eschar over the tip of her great toe.  We have gone ahead and made a vascular surgery referral with her abnormal ABIs.  She will follow-up here in approximately 2 weeks.  Follow-Up Instructions: Return in about 2 weeks (around 09/29/2018).   Ortho Exam  Patient is alert, oriented,  no adenopathy, well-dressed, normal affect, normal respiratory effort. The patient has a eschar over the tip of her right great toe approximately 1 cm in diameter with thick fibrous tissue present.  She has less tenderness to palpation.  Her pulses are monophasic by Doppler in the right foot.  Imaging: No results found.   Labs: No results found for: HGBA1C, ESRSEDRATE, CRP, LABURIC, REPTSTATUS, GRAMSTAIN, CULT, LABORGA   No results found for: ALBUMIN, PREALBUMIN, LABURIC  Body mass index is 26.85 kg/m.  Orders:  Orders Placed This Encounter  Procedures  . Ambulatory referral to Vascular Surgery   No orders of the defined types were placed in this encounter.    Procedures: No procedures performed  Clinical Data: No additional findings.  ROS:  All other systems negative, except as noted in the HPI. Review of Systems  Objective: Vital Signs: Ht 5' 7.5" (1.715 m)   Wt 174 lb (78.9 kg)   BMI 26.85 kg/m   Specialty Comments:  No specialty comments available.  PMFS History: Patient Active Problem List   Diagnosis Date Noted  . Lumbar pain 10/27/2017  . Acute non-recurrent maxillary sinusitis 10/27/2017  . 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 Hypothyroidism   .  Seasonal allergies   . Thyroid disease   . Vaginal delivery   . VIN III (vulvar intraepithelial neoplasia III)   . Vulvar cancer ([place])     Family History  Problem Relation Age of Onset  . Hypothyroidism Mother   . Diabetes Mother   . Stroke Mother   . Hypertension Mother   . Uterine cancer Mother   . Prostate cancer Father   . Hypothyroidism Sister   . Breast cancer Paternal Aunt   . Breast cancer Paternal Aunt   . Breast cancer Paternal Aunt     Past Surgical History:  Procedure Laterality Date  . VULVAR LESION REMOVAL  08/04/12   WLE of the vulva   Social History   Occupational History  . Not on file  Tobacco Use  . Smoking status: Former Smoker     Start date: 07/20/1977  . Smokeless tobacco: Never Used  Substance and Sexual Activity  . Alcohol use: Yes    Alcohol/week: 2.0 standard drinks    Types: 2 Glasses of wine per week    Comment: occas  . Drug use: No  . Sexual activity: Yes

## 2018-09-29 ENCOUNTER — Ambulatory Visit (INDEPENDENT_AMBULATORY_CARE_PROVIDER_SITE_OTHER): Payer: BLUE CROSS/BLUE SHIELD | Admitting: Orthopedic Surgery

## 2018-10-04 ENCOUNTER — Encounter: Payer: Self-pay | Admitting: Vascular Surgery

## 2018-10-04 ENCOUNTER — Other Ambulatory Visit: Payer: Self-pay

## 2018-10-04 ENCOUNTER — Ambulatory Visit: Payer: BLUE CROSS/BLUE SHIELD | Admitting: Vascular Surgery

## 2018-10-04 VITALS — BP 171/80 | HR 100 | Temp 97.8°F | Resp 20 | Ht 67.5 in | Wt 174.0 lb

## 2018-10-04 DIAGNOSIS — I739 Peripheral vascular disease, unspecified: Secondary | ICD-10-CM | POA: Diagnosis not present

## 2018-10-04 NOTE — Progress Notes (Signed)
[facility]  Patient name: Meagan Riddle MRN: [id number] DOB: 1956-06-08 Sex: female  REASON FOR CONSULT: Evaluation ulceration tip of left great toe  HPI: Meagan Riddle is a 63 y.o. female, who is here today for evaluation of ulceration on the tip of her left great toe.  He is here with her husband.  This is been present for over 2 months.  She does not recall any specific injury to the tip of her toe.  She does report that she had a pedicure several days prior to noticing the ulceration on the tip of her toe.  This formed an eschar.  She has not had a great deal of discomfort associated with this.  She is undergone noninvasive studies suggesting some peripheral vascular occlusive disease and seeing me today for further discussion of this.  She is very active physically.  She specifically denies any claudication symptoms in either lower extremity.  History of cardiac disease.  Past Medical History:  Diagnosis Date  . Hypothyroidism   . Seasonal allergies   . Thyroid disease   . Vaginal delivery   . VIN III (vulvar intraepithelial neoplasia III)   . Vulvar cancer ([place])     Family History  Problem Relation Age of Onset  . Hypothyroidism Mother   . Diabetes Mother   . Stroke Mother   . Hypertension Mother   . Uterine cancer Mother   . Prostate cancer Father   . Hypothyroidism Sister   . Breast cancer Paternal Aunt   . Breast cancer Paternal Aunt   . Breast cancer Paternal Aunt     SOCIAL HISTORY: Social History   Socioeconomic History  . Marital status: Married    Spouse name: Not on file  . Number of children: Not on file  . Years of education: Not on file  . Highest education level: Not on file  Occupational History  . Not on file  Social Needs  . Financial resource strain: Not on file  . Food insecurity:    Worry: Not on file    Inability: Not on file  . Transportation needs:    Medical: Not on file     Non-medical: Not on file  Tobacco Use  . Smoking status: Former Smoker    Start date: 07/20/1977  . Smokeless tobacco: Never Used  Substance and Sexual Activity  . Alcohol use: Yes    Alcohol/week: 2.0 standard drinks    Types: 2 Glasses of wine per week    Comment: occas  . Drug use: No  . Sexual activity: Yes  Lifestyle  . Physical activity:    Days per week: Not on file    Minutes per session: Not on file  . Stress: Not on file  Relationships  . Social connections:    Talks on phone: Not on file    Gets together: Not on file    Attends religious service: Not on file    Active member of club or organization: Not on file    Attends meetings of clubs or organizations: Not on file    Relationship status: Not on file  . Intimate partner violence:    Fear of current or ex partner: Not on file    Emotionally abused: Not on file    Physically abused: Not on file    Forced sexual activity: Not on file  Other Topics Concern  . Not on file  Social History Narrative  . Not on file  Allergies  Allergen Reactions  . Sulfa Antibiotics Rash    Current Outpatient Medications  Medication Sig Dispense Refill  . Ascorbic Acid (VITAMIN C PO) Take by mouth.    . cilostazol (PLETAL) 100 MG tablet TAKE 1 TABLET BY MOUTH 2 TIMES PER DAY 30 MINUTES BEFORE OR 2 HOURS AFTER BREAKFAST AND DINNER    . fexofenadine-pseudoephedrine (ALLEGRA-D) 60-120 MG per tablet Take 1 tablet by mouth daily.    Marland Kitchen levothyroxine (SYNTHROID, LEVOTHROID) 137 MCG tablet Take 137 mcg by mouth daily.    Marland Kitchen liothyronine (CYTOMEL) 5 MCG tablet Take 5 mcg by mouth daily.    . nitroGLYCERIN (NITRODUR - DOSED IN MG/24 HR) 0.2 mg/hr patch Place 1 patch (0.2 mg total) onto the skin daily. 30 patch 0  . SANTYL ointment     . VITAMIN D, ERGOCALCIFEROL, PO Take by mouth daily.     No current facility-administered medications for this visit.     REVIEW OF SYSTEMS:  [X]  denotes positive finding, [ ]  denotes negative  finding Cardiac  Comments:  Chest pain or chest pressure:    Shortness of breath upon exertion:    Short of breath when lying flat:    Irregular heart rhythm:        Vascular    Pain in calf, thigh, or hip brought on by ambulation:    Pain in feet at night that wakes you up from your sleep:     Blood clot in your veins:    Leg swelling:         Pulmonary    Oxygen at home:    Productive cough:     Wheezing:         Neurologic    Sudden weakness in arms or legs:     Sudden numbness in arms or legs:     Sudden onset of difficulty speaking or slurred speech:    Temporary loss of vision in one eye:     Problems with dizziness:         Gastrointestinal    Blood in stool:     Vomited blood:         Genitourinary    Burning when urinating:     Blood in urine:        Psychiatric    Major depression:         Hematologic    Bleeding problems:    Problems with blood clotting too easily:        Skin    Rashes or ulcers:        Constitutional    Fever or chills:      PHYSICAL EXAM: Vitals:   10/04/18 1018  BP: (!) 171/80  Pulse: 100  Resp: 20  Temp: 97.8 F (36.6 C)  SpO2: 96%  Weight: 174 lb (78.9 kg)  Height: 5' 7.5" (1.715 m)    GENERAL: The patient is a well-nourished female, in no acute distress. The vital signs are documented above. CARDIOVASCULAR: Plus radial pulses.  Absent pedal pulses bilaterally. PULMONARY: There is good air exchange  ABDOMEN: Soft and non-tender  MUSCULOSKELETAL: There are no major deformities or cyanosis. NEUROLOGIC: No focal weakness or paresthesias are detected. SKIN: She does have a 1 cm eschar on the tip of her left great toe. PSYCHIATRIC: The patient has a normal affect.  DATA:  Noninvasive studies revealed noncompressible vessels so therefore ABIs not available are calculable on 07/29/2018 study  Duplex imaging suggested significant stenosis in her left common  iliac artery and right superficial femoral artery  MEDICAL  ISSUES: I discussed the significance with patient.  She is not having any claudication symptoms or other symptoms related to peripheral vascular occlusive disease.  I do feel that she has adequate flow for healing her toe ulceration.  I am concerned that this may be embolic from her left common iliac artery lesion.  I have recommended CT angiogram abdomen pelvis and runoff for further evaluation to rule out proximal embolic source.  We will discuss this with her further following this evaluation.  She will continue her current treatment as directed regarding the toe ulcer   Rosetta Posner, MD [facility] Tel [phone] Pager [phone]

## 2022-12-28 ENCOUNTER — Encounter: Payer: Self-pay | Admitting: Family Medicine

## 2022-12-28 ENCOUNTER — Ambulatory Visit
Admission: RE | Admit: 2022-12-28 | Discharge: 2022-12-28 | Disposition: A | Discharge status: Home / Self Care | Payer: Medicare Other | Source: Ambulatory Visit | Attending: Family Medicine | Admitting: Family Medicine

## 2022-12-28 ENCOUNTER — Other Ambulatory Visit: Payer: Self-pay | Admitting: Family Medicine

## 2022-12-28 DIAGNOSIS — I639 Cerebral infarction, unspecified: Secondary | ICD-10-CM

## 2022-12-28 DIAGNOSIS — G510 Bell's palsy: Secondary | ICD-10-CM

## 2022-12-29 ENCOUNTER — Other Ambulatory Visit: Payer: Self-pay | Admitting: Family Medicine

## 2022-12-29 ENCOUNTER — Encounter: Payer: Self-pay | Admitting: Family Medicine

## 2022-12-29 DIAGNOSIS — I639 Cerebral infarction, unspecified: Secondary | ICD-10-CM

## 2022-12-30 ENCOUNTER — Ambulatory Visit
Admission: RE | Admit: 2022-12-30 | Discharge: 2022-12-30 | Disposition: A | Discharge status: Home / Self Care | Payer: Medicare Other | Source: Ambulatory Visit | Attending: Family Medicine | Admitting: Family Medicine

## 2022-12-30 DIAGNOSIS — I639 Cerebral infarction, unspecified: Secondary | ICD-10-CM

## 2022-12-30 MED ORDER — GADOPICLENOL 0.5 MMOL/ML IV SOLN
7.5000 mL | Freq: Once | INTRAVENOUS | Status: AC | PRN
  Administered 2022-12-30: 7.5 mL via INTRAVENOUS

## 2022-12-31 ENCOUNTER — Encounter: Payer: Self-pay | Admitting: Physician Assistant

## 2023-01-01 ENCOUNTER — Encounter (HOSPITAL_COMMUNITY): Payer: Self-pay

## 2023-01-01 ENCOUNTER — Emergency Department (HOSPITAL_COMMUNITY)
Admission: EM | Admit: 2023-01-01 | Discharge: 2023-01-01 | Discharge status: Against Medical Advice | Payer: Managed Care, Other (non HMO) | Attending: Emergency Medicine | Admitting: Emergency Medicine

## 2023-01-01 ENCOUNTER — Other Ambulatory Visit: Payer: Self-pay

## 2023-01-01 DIAGNOSIS — I63512 Cerebral infarction due to unspecified occlusion or stenosis of left middle cerebral artery: Secondary | ICD-10-CM

## 2023-01-01 DIAGNOSIS — R739 Hyperglycemia, unspecified: Secondary | ICD-10-CM | POA: Diagnosis not present

## 2023-01-01 DIAGNOSIS — R4781 Slurred speech: Secondary | ICD-10-CM | POA: Diagnosis not present

## 2023-01-01 DIAGNOSIS — E039 Hypothyroidism, unspecified: Secondary | ICD-10-CM | POA: Insufficient documentation

## 2023-01-01 DIAGNOSIS — I639 Cerebral infarction, unspecified: Secondary | ICD-10-CM | POA: Diagnosis not present

## 2023-01-01 DIAGNOSIS — R29810 Facial weakness: Secondary | ICD-10-CM | POA: Insufficient documentation

## 2023-01-01 DIAGNOSIS — Z5329 Procedure and treatment not carried out because of patient's decision for other reasons: Secondary | ICD-10-CM | POA: Diagnosis not present

## 2023-01-01 DIAGNOSIS — Z87891 Personal history of nicotine dependence: Secondary | ICD-10-CM | POA: Insufficient documentation

## 2023-01-01 DIAGNOSIS — I6389 Other cerebral infarction: Secondary | ICD-10-CM | POA: Insufficient documentation

## 2023-01-01 DIAGNOSIS — Z8544 Personal history of malignant neoplasm of other female genital organs: Secondary | ICD-10-CM | POA: Insufficient documentation

## 2023-01-01 DIAGNOSIS — Z79890 Hormone replacement therapy (postmenopausal): Secondary | ICD-10-CM | POA: Insufficient documentation

## 2023-01-01 LAB — I-STAT CHEM 8, ED
BUN: 27 mg/dL — ABNORMAL HIGH (ref 8–23)
Calcium, Ion: 1.21 mmol/L (ref 1.15–1.40)
Chloride: 103 mmol/L (ref 98–111)
Creatinine, Ser: 0.9 mg/dL (ref 0.44–1.00)
Glucose, Bld: 117 mg/dL — ABNORMAL HIGH (ref 70–99)
HCT: 45 % (ref 36.0–46.0)
Hemoglobin: 15.3 g/dL — ABNORMAL HIGH (ref 12.0–15.0)
Potassium: 4.5 mmol/L (ref 3.5–5.1)
Sodium: 138 mmol/L (ref 135–145)
TCO2: 26 mmol/L (ref 22–32)

## 2023-01-01 LAB — DIFFERENTIAL
Abs Immature Granulocytes: 0.03 10*3/uL (ref 0.00–0.07)
Basophils Absolute: 0.1 10*3/uL (ref 0.0–0.1)
Basophils Relative: 1 %
Eosinophils Absolute: 0.1 10*3/uL (ref 0.0–0.5)
Eosinophils Relative: 1 %
Immature Granulocytes: 0 %
Lymphocytes Relative: 23 %
Lymphs Abs: 1.9 10*3/uL (ref 0.7–4.0)
Monocytes Absolute: 0.6 10*3/uL (ref 0.1–1.0)
Monocytes Relative: 8 %
Neutro Abs: 5.7 10*3/uL (ref 1.7–7.7)
Neutrophils Relative %: 67 %

## 2023-01-01 LAB — COMPREHENSIVE METABOLIC PANEL
ALT: 20 U/L (ref 0–44)
AST: 20 U/L (ref 15–41)
Albumin: 4.2 g/dL (ref 3.5–5.0)
Alkaline Phosphatase: 79 U/L (ref 38–126)
Anion gap: 10 (ref 5–15)
BUN: 25 mg/dL — ABNORMAL HIGH (ref 8–23)
CO2: 24 mmol/L (ref 22–32)
Calcium: 9.8 mg/dL (ref 8.9–10.3)
Chloride: 102 mmol/L (ref 98–111)
Creatinine, Ser: 0.97 mg/dL (ref 0.44–1.00)
GFR, Estimated: >60 mL/min (ref >60)
Glucose, Bld: 119 mg/dL — ABNORMAL HIGH (ref 70–99)
Potassium: 4.5 mmol/L (ref 3.5–5.1)
Sodium: 136 mmol/L (ref 135–145)
Total Bilirubin: 0.3 mg/dL (ref 0.3–1.2)
Total Protein: 6.8 g/dL (ref 6.5–8.1)

## 2023-01-01 LAB — CBG MONITORING, ED: Glucose-Capillary: 121 mg/dL — ABNORMAL HIGH (ref 70–99)

## 2023-01-01 LAB — CBC
HCT: 44.4 % (ref 36.0–46.0)
Hemoglobin: 14.4 g/dL (ref 12.0–15.0)
MCH: 32.1 pg (ref 26.0–34.0)
MCHC: 32.4 g/dL (ref 30.0–36.0)
MCV: 99.1 fL (ref 80.0–100.0)
Platelets: 225 10*3/uL (ref 150–400)
RBC: 4.48 MIL/uL (ref 3.87–5.11)
RDW: 13 % (ref 11.5–15.5)
WBC: 8.4 10*3/uL (ref 4.0–10.5)
nRBC: 0 % (ref 0.0–0.2)

## 2023-01-01 LAB — PROTIME-INR
INR: 1 (ref 0.8–1.2)
Prothrombin Time: 13.8 seconds (ref 11.4–15.2)

## 2023-01-01 LAB — ETHANOL: Alcohol, Ethyl (B): <10 mg/dL (ref <10)

## 2023-01-01 LAB — APTT: aPTT: 25 seconds (ref 24–36)

## 2023-01-01 MED ORDER — ASPIRIN 81 MG PO CHEW: 81.0000 mg | CHEWABLE_TABLET | Freq: Every day | ORAL | Status: DC

## 2023-01-01 MED ORDER — METOPROLOL TARTRATE 25 MG PO TABS: 25.0000 mg | ORAL_TABLET | Freq: Once | ORAL | Status: DC

## 2023-01-01 MED ORDER — METOPROLOL TARTRATE 5 MG/5ML IV SOLN: 5.0000 mg | Freq: Once | INTRAVENOUS | Status: DC

## 2023-01-01 MED ORDER — ASPIRIN 300 MG RE SUPP: 300.0000 mg | Freq: Every day | RECTAL | Status: DC

## 2023-01-01 MED ORDER — CLOPIDOGREL BISULFATE 75 MG PO TABS: 75.0000 mg | ORAL_TABLET | Freq: Every day | ORAL | 0 refills | Status: AC

## 2023-01-01 MED ORDER — CLOPIDOGREL BISULFATE 75 MG PO TABS: 75.0000 mg | ORAL_TABLET | Freq: Every day | ORAL | Status: DC

## 2023-01-01 MED ORDER — LORAZEPAM 2 MG/ML IJ SOLN: 1.0000 mg | Freq: Once | INTRAMUSCULAR | Status: DC

## 2023-01-01 NOTE — Discharge Instructions (Addendum)
Please return if you decide you want to stay for further stroke work up. Return if you have any new or worsening symptoms or change your mind about admission.  Take plavix as prescribed and aspirin 81 mg each day Call Dr. Hal Hope as soon as possible.

## 2023-01-01 NOTE — Consult Note (Signed)
NEUROLOGY CONSULTATION NOTE   Date of service: January 01, 2023 Patient Name: Meagan Riddle MRN:  [id number] DOB:  1956/01/01 Reason for consult: "Rfacial droop, stroke on MRI" Requesting Provider: Margarita Grizzle, MD _ _ _   _ __   _ __ _ _  __ __   _ __   __ _  History of Present Illness  Meagan Riddle is a 67 y.o. female with PMH significant for hypothyroidism, seasonal allergies, who was vacationing in [place] about 2 weeks ago when she developed sudden onset right lower facial droop.  She reports her speech is slurry, liquid strip out of the right corner of her mouth.  When she returned from vacation, she went to her PCP who initially treated this as a Bell's palsy with no improvement in symptoms.  This prompted them to get an MRI of her brain with and without contrast which demonstrated small left frontal cortical diffusion restriction consistent with acute infarcts.  She denies any prior history of strokes, denies history of diabetes, reports her blood pressure is well-controlled, reports recently had her cholesterol checked by her PCP but I am unable recent labs.  She reports been trying to cut down on smoking and is down to about half a pack a day.  She starting smoking in high school, quit in 20s but then started back again in her 30s.  Denies any history of A-fib, no prior history of strokes.   LKW: 12/19/2022. mRS: 0 tNKASE: Not offered as she is outside the window. Thrombectomy: Not offered as she is outside the window. NIHSS components Score: Comment  1a Level of Conscious 0[x]  1[]  2[]  3[]      1b LOC Questions 0[x]  1[]  2[]       1c LOC Commands 0[x]  1[]  2[]       2 Best Gaze 0[x]  1[]  2[]       3 Visual 0[x]  1[]  2[]  3[]      4 Facial Palsy 0[]  1[x]  2[]  3[]      5a Motor Arm - left 0[x]  1[]  2[]  3[]  4[]  UN[]    5b Motor Arm - Right 0[x]  1[]  2[]  3[]  4[]  UN[]    6a Motor Leg - Left 0[x]  1[]  2[]  3[]  4[]  UN[]    6b Motor Leg - Right 0[x]  1[]  2[]  3[]  4[]  UN[]    7 Limb Ataxia 0[x]  1[]  2[]   3[]  UN[]     8 Sensory 0[x]  1[]  2[]  UN[]      9 Best Language 0[x]  1[]  2[]  3[]      10 Dysarthria 0[]  1[x]  2[]  UN[]      11 Extinct. and Inattention 0[x]  1[]  2[]       TOTAL: 2      ROS   Constitutional Denies weight loss, fever and chills.   HEENT Denies changes in vision and hearing.   Respiratory Denies SOB and cough.   CV Denies palpitations and CP   GI Denies abdominal pain, nausea, vomiting and diarrhea.   GU Denies dysuria and urinary frequency.   MSK Denies myalgia and joint pain.   Skin Denies rash and pruritus.   Neurological Denies headache and syncope.   Psychiatric Denies recent changes in mood. Denies anxiety and depression.    Past History   Past Medical History:  Diagnosis Date   Hypothyroidism    Seasonal allergies    Thyroid disease    Vaginal delivery    VIN III (vulvar intraepithelial neoplasia III)    Vulvar cancer (HCC)    Past Surgical History:  Procedure Laterality Date   VULVAR  LESION REMOVAL  08/04/12   WLE of the vulva   Family History  Problem Relation Age of Onset   Hypothyroidism Mother    Diabetes Mother    Stroke Mother    Hypertension Mother    Uterine cancer Mother    Prostate cancer Father    Hypothyroidism Sister    Breast cancer Paternal Aunt    Breast cancer Paternal Aunt    Breast cancer Paternal Aunt    Social History   Socioeconomic History   Marital status: Married    Spouse name: Not on file   Number of children: Not on file   Years of education: Not on file   Highest education level: Not on file  Occupational History   Not on file  Tobacco Use   Smoking status: Former    Types: Cigarettes    Start date: 07/20/1977   Smokeless tobacco: Never  Vaping Use   Vaping Use: Never used  Substance and Sexual Activity   Alcohol use: Yes    Alcohol/week: 2.0 standard drinks of alcohol    Types: 2 Glasses of wine per week    Comment: occas   Drug use: No   Sexual activity: Yes  Other Topics Concern   Not on file   Social History Narrative   Not on file   Social Determinants of Health   Financial Resource Strain: Not on file  Food Insecurity: Not on file  Transportation Needs: Not on file  Physical Activity: Not on file  Stress: Not on file  Social Connections: Not on file   Allergies  Allergen Reactions   Sulfa Antibiotics Rash    Medications  (Not in a hospital admission)    Vitals   Vitals:   01/01/23 1024 01/01/23 1030  BP: (!) 157/81   Pulse: (!) 111   Resp: 19   Temp: 98.3 F (36.8 C)   TempSrc: Oral   SpO2: 98%   Weight:  78.9 kg  Height:  5' 7.5" (1.715 m)     Body mass index is 26.84 kg/m.  Physical Exam   General: Laying comfortably in bed; in no acute distress.  HENT: Normal oropharynx and mucosa. Normal external appearance of ears and nose.  Neck: Supple, no pain or tenderness  CV: No JVD. No peripheral edema.  Pulmonary: Symmetric Chest rise. Normal respiratory effort.  Abdomen: Soft to touch, non-tender.  Ext: No cyanosis, edema, or deformity  Skin: No rash. Normal palpation of skin.   Musculoskeletal: Normal digits and nails by inspection. No clubbing.   Neurologic Examination  Mental status/Cognition: Alert, oriented to self, place, month and year, good attention.  Speech/language: Dysarthric speech, fluent, comprehension intact, object naming intact, repetition intact.  Cranial nerves:   CN II Pupils equal and reactive to light, no VF deficits    CN III,IV,VI EOM intact, no gaze preference or deviation, no nystagmus    CN V normal sensation in V1, V2, and V3 segments bilaterally    CN VII Right facial droop   CN VIII normal hearing to speech    CN IX & X normal palatal elevation, no uvular deviation    CN XI 5/5 head turn and 5/5 shoulder shrug bilaterally    CN XII midline tongue protrusion    Motor:  Muscle bulk: normal, tone normal, pronator drift none tremor none Mvmt Root Nerve  Muscle Right Left Comments  SA C5/6 Ax Deltoid 5 5   EF  C5/6 Mc Biceps 5 5  EE C6/7/8 Rad Triceps 5 5   WF C6/7 Med FCR     WE C7/8 PIN ECU     F Ab C8/T1 U ADM/FDI 5 5   HF L1/2/3 Fem Illopsoas 5 5   KE L2/3/4 Fem Quad 5 5   DF L4/5 D Peron Tib Ant 5 5   PF S1/2 Tibial Grc/Sol 5 5    Sensation:  Light touch Intact throughout   Pin prick    Temperature    Vibration   Proprioception    Coordination/Complex Motor:  - Finger to Nose intact bilaterally - Heel to shin intact bilaterally - Rapid alternating movement are normal - Gait: Deferred for patient's safety. Labs   CBC:  Recent Labs  Lab 01/01/23 1041 01/01/23 1046  WBC 8.4  --   NEUTROABS 5.7  --   HGB 14.4 15.3*  HCT 44.4 45.0  MCV 99.1  --   PLT 225  --     Basic Metabolic Panel:  Lab Results  Component Value Date   NA 138 01/01/2023   K 4.5 01/01/2023   CO2 24 01/01/2023   GLUCOSE 117 (H) 01/01/2023   BUN 27 (H) 01/01/2023   CREATININE 0.90 01/01/2023   CALCIUM 9.8 01/01/2023   GFRNONAA >60 01/01/2023   Lipid Panel: No results found for: "LDLCALC" HgbA1c: No results found for: "HGBA1C" Urine Drug Screen: No results found for: "LABOPIA", "COCAINSCRNUR", "LABBENZ", "AMPHETMU", "THCU", "LABBARB"  Alcohol Level     Component Value Date/Time   ETH <10 01/01/2023 1041    CT Head without contrast(Personally reviewed): Left frontal cortical hypodensities concerning for acute strokes.  CT angio Head and Neck with contrast: pending  MRI Brain(Personally reviewed): Patchy areas of left frontal cortical strokes.  Impression   Meagan Riddle is a 67 y.o. female with PMH significant for smoker, hypothyroidism who presents with right facial droop and slurred speech and found to have small patchy cortical left frontal stroke.  The stroke appears embolic on imaging.  Recommendations  - Frequent Neuro checks per stroke unit protocol - Recommend brain imaging with MRI Brain without contrast - Recommend Vascular imaging with CT angio head and neck - Recommend  obtaining TTE with bubble study - Recommend obtaining Lipid panel with LDL - Please start statin if LDL > 70 - Recommend HbA1c to evaluate for diabetes and how well it is controlled. - Antithrombotic -aspirin 81 mg daily along with Plavix 75 mg daily for 21 days, followed by aspirin 81 mg daily alone - Recommend DVT ppx - SBP goal -gradual normotension- Recommend Telemetry monitoring for arrythmia - Recommend bedside swallow screen prior to PO intake. - Stroke education booklet - Recommend PT/OT/SLP consult - Recommend Urine Tox screen.  ______________________________________________________________________   Thank you for the opportunity to take part in the care of this patient. If you have any further questions, please contact the neurology consultation attending.  Signed,  Erick Blinks [facility] _ _ _   _ __   _ __ _ _  __ __   _ __   __ _

## 2023-01-01 NOTE — ED Triage Notes (Signed)
Pt reports facial droop and slurred speech started 2 weeks ago on vacation, came back to see PCP who thought it was Bell's Palsy and treated pt for that, then MRI and CT showed CVA. Pt received results yesterday of the scans. Pt has facial droop and slurred speech.

## 2023-01-01 NOTE — ED Provider Notes (Signed)
[facility] Provider Note   CSN: [id number] Arrival date & time: 01/01/23  1017     History  Chief Complaint  Patient presents with   Stroke-like symptoms    Meagan Riddle is a 67 y.o. female.  HPI 67 year old female presents today complaining of stroke patient had right facial droop that began approximately 2 weeks ago while she was on vacation.  She denies any other symptoms.  She did not think that it was significant.  After arrival back home from her vacation she went saw her primary care doctor.  She was initially treated for Bell's palsy.    Home Medications Prior to Admission medications   Medication Sig Start Date End Date Taking? Authorizing Provider  Ascorbic Acid (VITAMIN C PO) Take by mouth.    [provider]  cilostazol (PLETAL) 100 MG tablet TAKE 1 TABLET BY MOUTH 2 TIMES PER DAY 30 MINUTES BEFORE OR 2 HOURS AFTER BREAKFAST AND DINNER 09/14/18   [provider]  fexofenadine-pseudoephedrine (ALLEGRA-D) 60-120 MG per tablet Take 1 tablet by mouth daily.    [provider]  levothyroxine (SYNTHROID, LEVOTHROID) 137 MCG tablet Take 137 mcg by mouth daily.    [provider]  liothyronine (CYTOMEL) 5 MCG tablet Take 5 mcg by mouth daily.    [provider]  nitroGLYCERIN (NITRODUR - DOSED IN MG/24 HR) 0.2 mg/hr patch Place 1 patch (0.2 mg total) onto the skin daily. 08/31/18   Adonis Huguenin, NP  SANTYL ointment  08/03/18   [provider]  VITAMIN D, ERGOCALCIFEROL, PO Take by mouth daily.    [provider]      Allergies    Sulfa antibiotics    Review of Systems   Review of Systems  Physical Exam Updated Vital Signs BP (!) 157/81 (BP Location: Right Arm)   Pulse (!) 111   Temp 98.3 F (36.8 C) (Oral)   Resp 19   Ht 1.715 m (5' 7.5")   Wt 78.9 kg   SpO2 98%   BMI 26.84 kg/m  Physical Exam Vitals and nursing note reviewed.  HENT:     Head:  Normocephalic and atraumatic.     Right Ear: External ear normal.     Left Ear: External ear normal.     Nose: Nose normal.     Mouth/Throat:     Pharynx: Oropharynx is clear.     Comments: Right-sided facial droop Eyes:     Extraocular Movements: Extraocular movements intact.     Conjunctiva/sclera: Conjunctivae normal.     Pupils: Pupils are equal, round, and reactive to light.  Cardiovascular:     Rate and Rhythm: Tachycardia present.  Pulmonary:     Effort: Pulmonary effort is normal.  Abdominal:     General: Bowel sounds are normal.     Palpations: Abdomen is soft.  Musculoskeletal:        General: Normal range of motion.     Cervical back: Normal range of motion.  Skin:    General: Skin is warm.     Capillary Refill: Capillary refill takes less than 2 seconds.  Neurological:     General: No focal deficit present.     Mental Status: She is alert.     Cranial Nerves: No cranial nerve deficit.     Motor: No weakness.     Coordination: Coordination normal.     Gait: Gait normal.  Psychiatric:  Mood and Affect: Mood normal.     ED Results / Procedures / Treatments   Labs (all labs ordered are listed, but only abnormal results are displayed) Labs Reviewed  COMPREHENSIVE METABOLIC PANEL - Abnormal; Notable for the following components:      Result Value   Glucose, Bld 119 (*)    BUN 25 (*)    All other components within normal limits  I-STAT CHEM 8, ED - Abnormal; Notable for the following components:   BUN 27 (*)    Glucose, Bld 117 (*)    Hemoglobin 15.3 (*)    All other components within normal limits  CBG MONITORING, ED - Abnormal; Notable for the following components:   Glucose-Capillary 121 (*)    All other components within normal limits  PROTIME-INR  APTT  CBC  DIFFERENTIAL  ETHANOL  LIPID PANEL  HEMOGLOBIN A1C    EKG EKG Interpretation  Date/Time:  Friday January 01 2023 11:40:26 EDT Ventricular Rate:  100 PR Interval:  143 QRS  Duration: 101 QT Interval:  378 QTC Calculation: 488 R Axis:   1 Text Interpretation: Sinus tachycardia Right atrial enlargement Consider right ventricular hypertrophy Inferior infarct, old Confirmed by Margarita Grizzle [phone]) on 01/01/2023 11:44:59 AM  Radiology MR BRAIN W WO CONTRAST  Result Date: 12/30/2022 CLINICAL DATA:  Facial droop and slurred speech beginning 8 days ago EXAM: MRI HEAD WITHOUT AND WITH CONTRAST TECHNIQUE: Multiplanar, multiecho pulse sequences of the brain and surrounding structures were obtained without and with intravenous contrast. CONTRAST:  7.5 mL Vueway COMPARISON:  12/28/2022 CT head, no prior MRI available. FINDINGS: Brain: Small foci of restricted diffusion with ADC correlates in the left frontal cortex (series 3, image 85 and 86; series 4, images 38 and 39), consistent with acute infarcts. Slightly less hyperintense focus of increased signal on diffusion-weighted imaging without definite ADC correlates in the left frontal cortex (series 3, images 84-77), which is associated with contrast enhancement (series 15, images 92-111), consistent with subacute infarcts. These areas are associated with T2 hyperintense signal. No other abnormal parenchymal or meningeal enhancement. No acute hemorrhage, mass, mass effect, or midline shift. No hydrocephalus or extra-axial collection. Normal pituitary and craniocervical junction. No hemosiderin deposition to suggest remote hemorrhage. Remote infarcts in the tail of the right caudate and posterior right lentiform nucleus. Vascular: Normal arterial flow voids. Normal arterial and venous enhancement. Skull and upper cervical spine: Normal marrow signal. Sinuses/Orbits: Clear paranasal sinuses. No acute finding in the orbits. Other: The mastoid air cells are well aerated. IMPRESSION: 1. Small foci of restricted diffusion in the left frontal cortex, consistent with acute infarcts. The patient was advised to go to a local emergency room for acute  stroke workup. 2. Additional cortical foci of increased signal on diffusion-weighted imaging without definite ADC correlates in the left frontal lobe, which is associated with contrast enhancement, consistent with subacute infarcts. Electronically Signed   By: Wiliam Ke M.D.   On: 12/30/2022 18:06    Procedures .Critical Care  Performed by: Margarita Grizzle, MD Authorized by: Margarita Grizzle, MD   Critical care provider statement:    Critical care time (minutes):  30   Critical care was time spent personally by me on the following activities:  Development of treatment plan with patient or surrogate, discussions with consultants, evaluation of patient's response to treatment, examination of patient, ordering and review of laboratory studies, ordering and review of radiographic studies, ordering and performing treatments and interventions, pulse oximetry, re-evaluation of patient's condition and  review of old charts     Medications Ordered in ED Medications  aspirin chewable tablet 81 mg (has no administration in time range)    Or  aspirin suppository 300 mg (has no administration in time range)  clopidogrel (PLAVIX) tablet 75 mg (has no administration in time range)  LORazepam (ATIVAN) injection 1 mg (has no administration in time range)    ED Course/ Medical Decision Making/ A&P Clinical Course as of 01/01/23 1303  Fri Jan 01, 2023  1211 Complete metabolic panel reviewed interpreted within normal limits with the exception of slightly elevated glucose at 119 and BUN at 25 [DR]  1211 CBC reviewed interpreted within normal limits [DR]    Clinical Course User Index [DR] Margarita Grizzle, MD                             Medical Decision Making Amount and/or Complexity of Data Reviewed Labs: ordered.  Risk Prescription drug management.   67 year old female risk factor smoking hypertension, higher lipids presents today with completed stroke.  Patient had workup done as outpatient.   Patient has positive MRI consistent with her stroke symptoms.  Here in the ED she is evaluated with EKG and labs.  EKG shows sinus tachycardia no A-fib. She is mildly hypertensive and tachycardic. Care discussed with Dr Mamie Nick, on-call for neurohospitalist and advises patient will need admission for full stroke work up.   Discussed with patient and husband Patient now insistent on leaving.  Extensive conversation with patient, husband, and Dr. Hal Hope (via phone) about risks of leaving.  Patient strongly advised to stay in hospital.   The patient has decided to leave against medical advice.  The patient had a normal mental status examination and understands  her condition and the risks of leaving including debilitating stroke, and death.  permanent disability. The patient has had an opportunity to ask  questions about her medical condition. The patient has been informed  that she may return for care at any time and has been referred to her  physician. The patient's family member was present for the conversation.       Final Clinical Impression(s) / ED Diagnoses Final diagnoses:  Cerebrovascular accident (CVA), unspecified mechanism (HCC)    Rx / DC Orders ED Discharge Orders     None         Margarita Grizzle, MD 01/01/23 1614

## 2023-01-05 ENCOUNTER — Other Ambulatory Visit: Payer: Self-pay | Admitting: Family Medicine

## 2023-01-05 DIAGNOSIS — I639 Cerebral infarction, unspecified: Secondary | ICD-10-CM

## 2023-01-06 ENCOUNTER — Other Ambulatory Visit: Payer: Medicare Other

## 2023-01-11 NOTE — Progress Notes (Deleted)
NEUROLOGY CONSULTATION NOTE  Meagan Riddle MRN: [id number] DOB: 1956/03/26  Referring provider: Nadyne Coombes, MD Primary care provider: Nadyne Coombes, MD  Reason for consult:  stroke  Assessment/Plan:   Small acute and subacute infarcts within the left frontal cortex, concerning for embolic strokes of unknown origin.   Continue Plavix 75mg  daily for secondary stroke prevention. Check CTA head and neck Check 2D echo with Bubble study Check lipid panel and Hgb A1c   Subjective:  Meagan Riddle is a 67 year old ***-handed female with hypertension, tobacco use, hypothyroidism and VIN III who presents for stroke.  History supplemented by *** and referring provider's notes.  MRI of brain and CTA *** personally reviewed.  While she was on vacation in ***, she developed right sided facial droop.  No headache, visual disturbance or unilateral numbness or weakness.  She didn't think much about it and did not seek medical attention at that time.  When she returned home, she saw her PCP who treated her for Bell's palsy but ordered MRI of brain to make sure it wasn't something else.  MRI of brain with and without contrast on 12/30/2022 did reveal small acute and subacute infarcts in the left frontal cortex.  She was advised to go to the ED where EKG showed sinus tachycardia but not a-fib.  Hospital admission was recommended but patient insisted to leave.  She was started on Plavix ***.     PAST MEDICAL HISTORY: Past Medical History:  Diagnosis Date   Hypothyroidism    Seasonal allergies    Thyroid disease    Vaginal delivery    VIN III (vulvar intraepithelial neoplasia III)    Vulvar cancer (HCC)     PAST SURGICAL HISTORY: Past Surgical History:  Procedure Laterality Date   VULVAR LESION REMOVAL  08/04/12   WLE of the vulva    MEDICATIONS: Current Outpatient Medications on File Prior to Visit  Medication Sig Dispense Refill   Ascorbic Acid (VITAMIN C PO) Take by mouth.      cilostazol (PLETAL) 100 MG tablet TAKE 1 TABLET BY MOUTH 2 TIMES PER DAY 30 MINUTES BEFORE OR 2 HOURS AFTER BREAKFAST AND DINNER     clopidogrel (PLAVIX) 75 MG tablet Take 1 tablet (75 mg total) by mouth daily. 30 tablet 0   fexofenadine-pseudoephedrine (ALLEGRA-D) 60-120 MG per tablet Take 1 tablet by mouth daily.     levothyroxine (SYNTHROID, LEVOTHROID) 137 MCG tablet Take 137 mcg by mouth daily.     liothyronine (CYTOMEL) 5 MCG tablet Take 5 mcg by mouth daily.     nitroGLYCERIN (NITRODUR - DOSED IN MG/24 HR) 0.2 mg/hr patch Place 1 patch (0.2 mg total) onto the skin daily. 30 patch 0   SANTYL ointment      VITAMIN D, ERGOCALCIFEROL, PO Take by mouth daily.     No current facility-administered medications on file prior to visit.    ALLERGIES: Allergies  Allergen Reactions   Sulfa Antibiotics Rash    FAMILY HISTORY: Family History  Problem Relation Age of Onset   Hypothyroidism Mother    Diabetes Mother    Stroke Mother    Hypertension Mother    Uterine cancer Mother    Prostate cancer Father    Hypothyroidism Sister    Breast cancer Paternal Aunt    Breast cancer Paternal Aunt    Breast cancer Paternal Aunt     Objective:  *** General: No acute distress.  Patient appears well-groomed.   Head:  Normocephalic/atraumatic Eyes:  fundi examined but not visualized Neck: supple, no paraspinal tenderness, full range of motion Back: No paraspinal tenderness Heart: regular rate and rhythm Lungs: Clear to auscultation bilaterally. Vascular: No carotid bruits. Neurological Exam: Mental status: alert and oriented to person, place, and time, speech fluent and not dysarthric, language intact. Cranial nerves: CN I: not tested CN II: pupils equal, round and reactive to light, visual fields intact CN III, IV, VI:  full range of motion, no nystagmus, no ptosis CN V: facial sensation intact. CN VII: upper and lower face symmetric CN VIII: hearing intact CN IX, X: gag intact, uvula  midline CN XI: sternocleidomastoid and trapezius muscles intact CN XII: tongue midline Bulk & Tone: normal, no fasciculations. Motor:  muscle strength 5/5 throughout Sensation:  Pinprick, temperature and vibratory sensation intact. Deep Tendon Reflexes:  2+ throughout,  toes downgoing.   Finger to nose testing:  Without dysmetria.   Heel to shin:  Without dysmetria.   Gait:  Normal station and stride.  Romberg negative.    Thank you for allowing me to take part in the care of this patient.  Shon Millet, DO  CC: ***

## 2023-01-12 ENCOUNTER — Ambulatory Visit: Payer: Medicare Other | Admitting: Neurology

## 2023-01-13 ENCOUNTER — Ambulatory Visit
Admission: RE | Admit: 2023-01-13 | Discharge: 2023-01-13 | Disposition: A | Discharge status: Home / Self Care | Payer: Managed Care, Other (non HMO) | Source: Ambulatory Visit | Attending: Family Medicine | Admitting: Family Medicine

## 2023-01-13 ENCOUNTER — Other Ambulatory Visit: Payer: Medicare Other

## 2023-01-13 DIAGNOSIS — I639 Cerebral infarction, unspecified: Secondary | ICD-10-CM

## 2023-01-13 MED ORDER — IOPAMIDOL (ISOVUE-370) INJECTION 76%
75.0000 mL | Freq: Once | INTRAVENOUS | Status: AC | PRN
  Administered 2023-01-13: 75 mL via INTRAVENOUS

## 2023-02-01 ENCOUNTER — Other Ambulatory Visit: Payer: Medicare Other

## 2023-02-17 ENCOUNTER — Ambulatory Visit: Payer: Medicare Other | Admitting: Cardiovascular Disease

## 2023-02-18 ENCOUNTER — Other Ambulatory Visit (HOSPITAL_COMMUNITY): Payer: Self-pay | Admitting: Family Medicine

## 2023-02-18 DIAGNOSIS — I10 Essential (primary) hypertension: Secondary | ICD-10-CM

## 2023-02-18 DIAGNOSIS — I739 Peripheral vascular disease, unspecified: Secondary | ICD-10-CM

## 2023-02-23 ENCOUNTER — Ambulatory Visit (HOSPITAL_COMMUNITY)
Admission: RE | Admit: 2023-02-23 | Discharge: 2023-02-23 | Disposition: A | Discharge status: Home / Self Care | Payer: Managed Care, Other (non HMO) | Source: Ambulatory Visit | Attending: Internal Medicine | Admitting: Internal Medicine

## 2023-02-23 DIAGNOSIS — I10 Essential (primary) hypertension: Secondary | ICD-10-CM | POA: Insufficient documentation

## 2023-02-23 DIAGNOSIS — I739 Peripheral vascular disease, unspecified: Secondary | ICD-10-CM | POA: Insufficient documentation

## 2023-02-23 LAB — VAS US ABI WITH/WO TBI
Left ABI: 0.37
Right ABI: 0.53

## 2023-04-01 ENCOUNTER — Telehealth: Payer: Self-pay | Admitting: Neurology

## 2023-04-01 NOTE — Telephone Encounter (Signed)
LVM and sent text msg informing pt of need to reschedule 04/06/23 appt - MD out

## 2023-04-06 ENCOUNTER — Ambulatory Visit: Payer: Managed Care, Other (non HMO) | Admitting: Neurology

## 2023-04-26 ENCOUNTER — Ambulatory Visit: Payer: Medicare Other | Admitting: Cardiovascular Disease

## 2023-05-06 ENCOUNTER — Encounter: Payer: Self-pay | Admitting: Neurology

## 2023-05-06 ENCOUNTER — Ambulatory Visit (INDEPENDENT_AMBULATORY_CARE_PROVIDER_SITE_OTHER): Payer: Managed Care, Other (non HMO) | Admitting: Neurology

## 2023-05-06 VITALS — BP 135/62 | HR 96 | Ht 68.0 in | Wt 169.8 lb

## 2023-05-06 DIAGNOSIS — I63512 Cerebral infarction due to unspecified occlusion or stenosis of left middle cerebral artery: Secondary | ICD-10-CM

## 2023-05-06 DIAGNOSIS — R471 Dysarthria and anarthria: Secondary | ICD-10-CM | POA: Diagnosis not present

## 2023-05-06 DIAGNOSIS — I6522 Occlusion and stenosis of left carotid artery: Secondary | ICD-10-CM

## 2023-05-06 DIAGNOSIS — I69392 Facial weakness following cerebral infarction: Secondary | ICD-10-CM | POA: Diagnosis not present

## 2023-05-06 NOTE — Progress Notes (Signed)
[facility] [address] [place]. [place] [address] [phone]       OFFICE CONSULT NOTE  Meagan. Meagan Riddle Date of Birth:  Dec 05, 1955 Medical Record Number:  [id number]   Referring MD: Nadyne Coombes  Reason for Referral: Stroke  HPI:Meagan Riddle is a pleasant 67 year old Caucasian lady seen today for initial office consultation visit for stroke.  History is obtained from her and review of electronic medical records and I personally reviewed pertinent available imaging films in PACS.  She has past medical history significant for hypothyroidism and seasonal allergies.  She was vacationing in [place] in early June when she developed sudden onset of right lower facial droop with slurred speech and saliva drooping from right corner of the mouth.  She did not seek immediate medical help but after she returned from a vacation she went to her primary care physician who initially thought she may have had Bell's palsy but her symptoms did not improve he ordered an outpatient MRI scan of the brain which was done on 12/30/2022 which showed acute left frontal cortical infarct as well as small subacute additional left frontal infarcts which showed postcontrast enhancement.  CT angiogram of the brain and was unremarkable CT angiogram of the neck showed calcification at both carotid bifurcations with 50% right ICA and 40% left ICA stenosis.  Patient did not have any lipid profile A1c checked echocardiogram and cardiac monitoring done.  She was placed on aspirin and Plavix for 3 weeks followed by Plavix alone.  She tolerated them well but does complain of easy bruising headaches now.  She states her blood pressure under good control.  She has a heavy smoker and was counseled to quit and has no reduced to 3 cigarettes/day and is taking daily Chantix.  She has also quit drinking alcohol and used to be a heavy drinker.  She is quite active and exercises regularly and likes biking.  She has had no  recurrent stroke or TIA symptoms.  She states that her speech is back to normal.  She still has very minimal right facial weakness but she does not have any drooling of saliva or liquids from the corner of her mouth yet.  She has no prior history of strokes TIAs significant neurological problems.  There is no family history of strokes.  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)     Social History:  Social History   Socioeconomic History   Marital status: Married    Spouse name: Not on file   Number of children: Not on file   Years of education: Not on file   Highest education level: Not on file  Occupational History   Not on file  Tobacco Use   Smoking status: Former    Types: Cigarettes    Start date: 07/20/1977   Smokeless tobacco: Never  Vaping Use   Vaping status: Never Used  Substance and Sexual Activity   Alcohol use: Yes    Alcohol/week: 2.0 standard drinks of alcohol    Types: 2 Glasses of wine per week    Comment: occas   Drug use: No   Sexual activity: Yes  Other Topics Concern  Not on file  Social History Narrative   Not on file   Social Determinants of Health   Financial Resource Strain: Not on file  Food Insecurity: Not on file  Transportation Needs: Not on file  Physical Activity: Not on file  Stress: Not on file  Social Connections: Unknown (11/28/2021)   Received from [facility], [facility]   Social Network    Social Network: Not on file  Intimate Partner Violence: Unknown (10/20/2021)   Received from [facility], [facility]   HITS    Physically Hurt: Not on file    Insult or Talk Down To: Not on file    Threaten Physical Harm: Not on file    Scream or  Curse: Not on file    Medications:   Current Outpatient Medications on File Prior to Visit  Medication Sig Dispense Refill   amLODipine (NORVASC) 5 MG tablet Take 5 mg by mouth daily.     Ascorbic Acid (VITAMIN C PO) Take by mouth.     clopidogrel (PLAVIX) 75 MG tablet Take 1 tablet (75 mg total) by mouth daily. 30 tablet 0   fexofenadine-pseudoephedrine (ALLEGRA-D) 60-120 MG per tablet Take 1 tablet by mouth daily.     fluticasone (FLONASE) 50 MCG/ACT nasal spray Place 1 spray into both nostrils 2 (two) times daily.     gabapentin (NEURONTIN) 300 MG capsule Take 300-600 mg by mouth daily as needed.     levothyroxine (SYNTHROID, LEVOTHROID) 137 MCG tablet Take 137 mcg by mouth daily.     liothyronine (CYTOMEL) 5 MCG tablet Take 5 mcg by mouth daily.     losartan-hydrochlorothiazide (HYZAAR) 100-12.5 MG tablet Take 1 tablet by mouth daily.     rosuvastatin (CRESTOR) 20 MG tablet Take 20 mg by mouth daily.     VITAMIN D, ERGOCALCIFEROL, PO Take by mouth daily.     cilostazol (PLETAL) 100 MG tablet TAKE 1 TABLET BY MOUTH 2 TIMES PER DAY 30 MINUTES BEFORE OR 2 HOURS AFTER BREAKFAST AND DINNER (Patient not taking: Reported on 05/06/2023)     nitroGLYCERIN (NITRODUR - DOSED IN MG/24 HR) 0.2 mg/hr patch Place 1 patch (0.2 mg total) onto the skin daily. (Patient not taking: Reported on 05/06/2023) 30 patch 0   SANTYL ointment  (Patient not taking: Reported on 05/06/2023)     No current facility-administered medications on file prior to visit.    Allergies:   Allergies  Allergen Reactions   Sulfa Antibiotics Rash    Physical Exam General: well developed, well nourished pleasant middle-age Caucasian lady, seated, in no evident distress Head: head normocephalic and atraumatic.   Neck: supple with soft left carotid bruit.   Cardiovascular: regular rate and rhythm, no murmurs Musculoskeletal: no deformity Skin:  no rash/petichiae Vascular:  Normal pulses all extremities  Neurologic  Exam Mental Status: Awake and fully alert. Oriented to place and time. Recent and remote memory intact. Attention span, concentration and fund of knowledge appropriate. Mood and affect appropriate.  Cranial Nerves: Fundoscopic exam reveals sharp disc margins. Pupils equal, briskly reactive to light. Extraocular movements full without nystagmus. Visual fields full to confrontation. Hearing intact. Facial sensation intact.  Mild right nasolabial fold weakness on smiling., tongue, palate moves normally and symmetrically.  Motor: Normal bulk and tone. Normal strength in all tested extremity muscles. Sensory.: intact to touch , pinprick , position and vibratory sensation.  Coordination: Rapid alternating movements normal in all extremities. Finger-to-nose and heel-to-shin performed accurately bilaterally. Gait and Station: Arises from chair  without difficulty. Stance is normal. Gait demonstrates normal stride length and balance . Able to heel, toe and tandem walk with mild difficulty.  Reflexes: 1+ and symmetric. Toes downgoing.   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  Vascular risk factors smoking heavy alcohol use , and carotid stenosis .     PLAN:I had a long d/w patient about  recent stroke and carotid stenosis, risk for recurrent stroke/TIAs, personally independently reviewed imaging studies and stroke evaluation results and answered questions.Continue Plavix 75 mg daily for secondary stroke prevention and maintain strict control of hypertension with blood pressure goal below 130/90, diabetes with hemoglobin A1c goal below 6.5% and lipids with LDL cholesterol goal below 70 mg/dL. I also advised the patient to eat a healthy diet with plenty of whole grains, cereals, fruits and vegetables, exercise regularly and maintain ideal body weight .check lipid  profile, hemoglobin A1c, echocardiogram, 30-day heart monitor for A-fib.  Check carotid ultrasound and if it shows greater than 50% stenosis may consider referral to vascular surgery for consideration for carotid endarterectomy versus angioplasty stenting.  I have complemented the patient on quitting alcohol and almost quitting smoking and encouraged her to pursue healthy lifestyle and maintain it.  Followup in the future with me in 4 months or call earlier if necessary.  Greater than 50% time during this 45-minute consultation visit was spent on counseling and coordination of care about her symptomatic carotid stenosis and stroke and resultant dysarthria and facial weakness and discussion about stroke prevention and treatment and answering questions.  Delia Heady, MD Note: This document was prepared with digital dictation and possible smart phrase technology. Any transcriptional errors that result from this process are unintentional.

## 2023-05-06 NOTE — Patient Instructions (Signed)
I had a long d/w patient about  recent stroke and carotid stenosis, risk for recurrent stroke/TIAs, personally independently reviewed imaging studies and stroke evaluation results and answered questions.Continue Plavix 75 mg daily for secondary stroke prevention and maintain strict control of hypertension with blood pressure goal below 130/90, diabetes with hemoglobin A1c goal below 6.5% and lipids with LDL cholesterol goal below 70 mg/dL. I also advised the patient to eat a healthy diet with plenty of whole grains, cereals, fruits and vegetables, exercise regularly and maintain ideal body weight .check lipid profile, hemoglobin A1c, echocardiogram, 30-day heart monitor for A-fib.  Check carotid ultrasound and if it shows greater than 50% stenosis may consider referral to vascular surgery for consideration for carotid endarterectomy versus angioplasty stenting.  I have complemented the patient on quitting alcohol and almost quitting smoking and encouraged her to pursue healthy lifestyle and maintain it.  Followup in the future with me in 4 months or call earlier if necessary.  Stroke Prevention Some medical conditions and behaviors can lead to a higher chance of having a stroke. You can help prevent a stroke by eating healthy, exercising, not smoking, and managing any medical conditions you have. Stroke is a leading cause of functional impairment. Primary prevention is particularly important because a majority of strokes are first-time events. Stroke changes the lives of not only those who experience a stroke but also their family and other caregivers. How can this condition affect me? A stroke is a medical emergency and should be treated right away. A stroke can lead to brain damage and can sometimes be life-threatening. If a person gets medical treatment right away, there is a better chance of surviving and recovering from a stroke. What can increase my risk? The following medical conditions may increase  your risk of a stroke: Cardiovascular disease. High blood pressure (hypertension). Diabetes. High cholesterol. Sickle cell disease. Blood clotting disorders (hypercoagulable state). Obesity. Sleep disorders (obstructive sleep apnea). Other risk factors include: Being older than age 78. Having a history of blood clots, stroke, or mini-stroke (transient ischemic attack, TIA). Genetic factors, such as race, ethnicity, or a family history of stroke. Smoking cigarettes or using other tobacco products. Taking birth control pills, especially if you also use tobacco. Heavy use of alcohol or drugs, especially cocaine and methamphetamine. Physical inactivity. What actions can I take to prevent this? Manage your health conditions High cholesterol levels. Eating a healthy diet is important for preventing high cholesterol. If cholesterol cannot be managed through diet alone, you may need to take medicines. Take any prescribed medicines to control your cholesterol as told by your health care provider. Hypertension. To reduce your risk of stroke, try to keep your blood pressure below 130/80. Eating a healthy diet and exercising regularly are important for controlling blood pressure. If these steps are not enough to manage your blood pressure, you may need to take medicines. Take any prescribed medicines to control hypertension as told by your health care provider. Ask your health care provider if you should monitor your blood pressure at home. Have your blood pressure checked every year, even if your blood pressure is normal. Blood pressure increases with age and some medical conditions. Diabetes. Eating a healthy diet and exercising regularly are important parts of managing your blood sugar (glucose). If your blood sugar cannot be managed through diet and exercise, you may need to take medicines. Take any prescribed medicines to control your diabetes as told by your health care provider. Get  evaluated for obstructive  sleep apnea. Talk to your health care provider about getting a sleep evaluation if you snore a lot or have excessive sleepiness. Make sure that any other medical conditions you have, such as atrial fibrillation or atherosclerosis, are managed. Nutrition Follow instructions from your health care provider about what to eat or drink to help manage your health condition. These instructions may include: Reducing your daily calorie intake. Limiting how much salt (sodium) you use to 1,500 milligrams (mg) each day. Using only healthy fats for cooking, such as olive oil, canola oil, or sunflower oil. Eating healthy foods. You can do this by: Choosing foods that are high in fiber, such as whole grains, and fresh fruits and vegetables. Eating at least 5 servings of fruits and vegetables a day. Try to fill one-half of your plate with fruits and vegetables at each meal. Choosing lean protein foods, such as lean cuts of meat, poultry without skin, fish, tofu, beans, and nuts. Eating low-fat dairy products. Avoiding foods that are high in sodium. This can help lower blood pressure. Avoiding foods that have saturated fat, trans fat, and cholesterol. This can help prevent high cholesterol. Avoiding processed and prepared foods. Counting your daily carbohydrate intake.  Lifestyle If you drink alcohol: Limit how much you have to: 0-1 drink a day for women who are not pregnant. 0-2 drinks a day for men. Know how much alcohol is in your drink. In the [place] one drink equals one 12 oz bottle of beer ( ), one 5 oz glass of wine ( ), or one 1 oz glass of hard liquor (44mL). Do not use any products that contain nicotine or tobacco. These products include cigarettes, chewing tobacco, and vaping devices, such as e-cigarettes. If you need help quitting, ask your health care provider. Avoid secondhand smoke. Do not use drugs. Activity  Try to stay at a healthy weight. Get at least  30 minutes of exercise on most days, such as: Fast walking. Biking. Swimming. Medicines Take over-the-counter and prescription medicines only as told by your health care provider. Aspirin or blood thinners (antiplatelets or anticoagulants) may be recommended to reduce your risk of forming blood clots that can lead to stroke. Avoid taking birth control pills. Talk to your health care provider about the risks of taking birth control pills if: You are over 44 years old. You smoke. You get very bad headaches. You have had a blood clot. Where to find more information American Stroke Association: www.strokeassociation.org Get help right away if: You or a loved one has any symptoms of a stroke. "BE FAST" is an easy way to remember the main warning signs of a stroke: B - Balance. Signs are dizziness, sudden trouble walking, or loss of balance. E - Eyes. Signs are trouble seeing or a sudden change in vision. F - Face. Signs are sudden weakness or numbness of the face, or the face or eyelid drooping on one side. A - Arms. Signs are weakness or numbness in an arm. This happens suddenly and usually on one side of the body. S - Speech. Signs are sudden trouble speaking, slurred speech, or trouble understanding what people say. T - Time. Time to call emergency services. Write down what time symptoms started. You or a loved one has other signs of a stroke, such as: A sudden, severe headache with no known cause. Nausea or vomiting. Seizure. These symptoms may represent a serious problem that is an emergency. Do not wait to see if the symptoms will go away. Get medical  help right away. Call your local emergency services (911 in the [place] Do not drive yourself to the hospital. Summary You can help to prevent a stroke by eating healthy, exercising, not smoking, limiting alcohol intake, and managing any medical conditions you may have. Do not use any products that contain nicotine or tobacco. These include  cigarettes, chewing tobacco, and vaping devices, such as e-cigarettes. If you need help quitting, ask your health care provider. Remember "BE FAST" for warning signs of a stroke. Get help right away if you or a loved one has any of these signs. This information is not intended to replace advice given to you by your health care provider. Make sure you discuss any questions you have with your health care provider. Document Revised: 06/08/2022 Document Reviewed: 06/08/2022 Elsevier Patient Education  2024 [employer].

## 2023-05-07 ENCOUNTER — Other Ambulatory Visit: Payer: Self-pay | Admitting: Neurology

## 2023-05-07 LAB — LIPID PANEL
Chol/HDL Ratio: 2.8 {ratio} (ref 0.0–4.4)
Cholesterol, Total: 180 mg/dL (ref 100–199)
HDL: 65 mg/dL (ref >39)
LDL Chol Calc (NIH): 91 mg/dL (ref 0–99)
Triglycerides: 139 mg/dL (ref 0–149)
VLDL Cholesterol Cal: 24 mg/dL (ref 5–40)

## 2023-05-07 LAB — HEMOGLOBIN A1C
Est. average glucose Bld gHb Est-mCnc: 123 mg/dL
Hgb A1c MFr Bld: 5.9 % — ABNORMAL HIGH (ref 4.8–5.6)

## 2023-05-07 NOTE — Progress Notes (Signed)
Kindly inform the patient that screening test for diabetes was satisfactory however cholesterol profile shows bad cholesterol to be elevated hence I recommend start taking Lipitor 20 mg daily.  Prescription will be sent to her pharmacy

## 2023-05-07 NOTE — Progress Notes (Signed)
Kindly inform the patient that cholesterol profile is not satisfactory with bad cholesterol yet elevated so I recommend increasing dose of Crestor from 20 to 40 mg daily

## 2023-05-10 ENCOUNTER — Telehealth: Payer: Self-pay

## 2023-05-10 NOTE — Telephone Encounter (Signed)
-----   Message from Delia Heady sent at 05/07/2023  9:25 AM EDT ----- Meagan Riddle inform the patient that screening test for diabetes was satisfactory however cholesterol profile shows bad cholesterol to be elevated hence I recommend start taking Lipitor 20 mg daily.  Prescription will be sent to her pharmacy

## 2023-05-10 NOTE — Telephone Encounter (Signed)
Called patient and informed her of the labs and starting Lipitor. Patient had a message and was sent to Dr Epimenio Foot. Patient would like to know if she could increased the dose of her Crestor because Lipitor does work them her. I will call patient back once I had a response from Dr.

## 2023-05-11 ENCOUNTER — Ambulatory Visit: Payer: Medicare Other | Admitting: Neurology

## 2023-05-11 NOTE — Telephone Encounter (Signed)
Called patient to inform her it okay to increase her Crestor to 40mg  daily to help with her bad cholesterol. Per Dr. Epimenio Foot. Pt verbalized understanding. Pt had no questions at this time but was encouraged to call back if questions arise.

## 2023-06-16 ENCOUNTER — Ambulatory Visit: Payer: Managed Care, Other (non HMO) | Admitting: Neurology

## 2023-06-28 ENCOUNTER — Ambulatory Visit: Payer: Medicare Other | Admitting: Cardiovascular Disease

## 2023-07-10 NOTE — Progress Notes (Signed)
Patient presented with stroke like symptoms and treated as per stroke order set

## 2023-10-19 ENCOUNTER — Encounter: Payer: Self-pay | Admitting: Neurology

## 2023-12-16 ENCOUNTER — Ambulatory Visit: Payer: Managed Care, Other (non HMO) | Admitting: Neurology

## 2023-12-28 ENCOUNTER — Ambulatory Visit: Admitting: Cardiovascular Disease

## 2024-02-15 ENCOUNTER — Telehealth: Payer: Self-pay | Admitting: Neurology

## 2024-02-15 NOTE — Telephone Encounter (Signed)
 Pt called to cancel appt due to having Covid   Appt cancel will call back to schedule

## 2024-02-22 ENCOUNTER — Ambulatory Visit: Admitting: Neurology
